# Patient Record
Sex: Female | Born: 1940 | Race: White | Hispanic: No | Marital: Married | Smoking: Never smoker
Health system: Southern US, Community
[De-identification: ages and names within clinical notes are randomized; demographics above are authoritative.]

## PROBLEM LIST (undated history)

## (undated) DIAGNOSIS — C50919 Malignant neoplasm of unspecified site of unspecified female breast: Secondary | ICD-10-CM

## (undated) DIAGNOSIS — D249 Benign neoplasm of unspecified breast: Secondary | ICD-10-CM

## (undated) DIAGNOSIS — N6459 Other signs and symptoms in breast: Secondary | ICD-10-CM

## (undated) DIAGNOSIS — K5790 Diverticulosis of intestine, part unspecified, without perforation or abscess without bleeding: Secondary | ICD-10-CM

## (undated) DIAGNOSIS — R011 Cardiac murmur, unspecified: Secondary | ICD-10-CM

## (undated) DIAGNOSIS — D0501 Lobular carcinoma in situ of right breast: Principal | ICD-10-CM

## (undated) HISTORY — DX: Cardiac murmur, unspecified: R01.1

## (undated) HISTORY — DX: Benign neoplasm of unspecified breast: D24.9

## (undated) HISTORY — DX: Diverticulosis of intestine, part unspecified, without perforation or abscess without bleeding: K57.90

## (undated) HISTORY — DX: Lobular carcinoma in situ of right breast: D05.01

---

## 1989-04-23 HISTORY — PX: BREAST EXCISIONAL BIOPSY: SUR124

## 1995-11-22 HISTORY — PX: BREAST EXCISIONAL BIOPSY: SUR124

## 2000-07-24 DIAGNOSIS — D0501 Lobular carcinoma in situ of right breast: Secondary | ICD-10-CM

## 2000-07-24 DIAGNOSIS — D249 Benign neoplasm of unspecified breast: Secondary | ICD-10-CM

## 2000-07-24 DIAGNOSIS — C50919 Malignant neoplasm of unspecified site of unspecified female breast: Secondary | ICD-10-CM

## 2000-07-24 HISTORY — DX: Lobular carcinoma in situ of right breast: D05.01

## 2000-07-24 HISTORY — PX: BREAST LUMPECTOMY: SHX2

## 2000-07-24 HISTORY — DX: Malignant neoplasm of unspecified site of unspecified female breast: C50.919

## 2000-07-24 HISTORY — DX: Benign neoplasm of unspecified breast: D24.9

## 2000-11-26 ENCOUNTER — Encounter: Payer: Self-pay | Admitting: General Surgery

## 2000-11-26 ENCOUNTER — Encounter: Admission: RE | Admit: 2000-11-26 | Discharge: 2000-11-26 | Payer: Self-pay | Admitting: General Surgery

## 2000-11-28 ENCOUNTER — Encounter: Admission: RE | Admit: 2000-11-28 | Discharge: 2000-11-28 | Payer: Self-pay | Admitting: General Surgery

## 2000-11-28 ENCOUNTER — Encounter (INDEPENDENT_AMBULATORY_CARE_PROVIDER_SITE_OTHER): Payer: Self-pay | Admitting: *Deleted

## 2000-11-28 ENCOUNTER — Ambulatory Visit (HOSPITAL_BASED_OUTPATIENT_CLINIC_OR_DEPARTMENT_OTHER): Admission: RE | Admit: 2000-11-28 | Discharge: 2000-11-28 | Payer: Self-pay | Admitting: General Surgery

## 2000-11-28 ENCOUNTER — Encounter: Payer: Self-pay | Admitting: General Surgery

## 2001-01-17 ENCOUNTER — Ambulatory Visit: Admission: RE | Admit: 2001-01-17 | Discharge: 2001-02-20 | Payer: Self-pay | Admitting: Radiation Oncology

## 2001-02-21 ENCOUNTER — Ambulatory Visit: Admission: RE | Admit: 2001-02-21 | Discharge: 2001-05-22 | Payer: Self-pay | Admitting: Radiation Oncology

## 2001-06-03 ENCOUNTER — Encounter: Payer: Self-pay | Admitting: General Surgery

## 2001-06-03 ENCOUNTER — Encounter: Admission: RE | Admit: 2001-06-03 | Discharge: 2001-06-03 | Payer: Self-pay | Admitting: General Surgery

## 2001-12-03 ENCOUNTER — Encounter: Payer: Self-pay | Admitting: General Surgery

## 2001-12-03 ENCOUNTER — Encounter: Admission: RE | Admit: 2001-12-03 | Discharge: 2001-12-03 | Payer: Self-pay | Admitting: General Surgery

## 2002-03-03 ENCOUNTER — Encounter: Payer: Self-pay | Admitting: Emergency Medicine

## 2002-03-03 ENCOUNTER — Emergency Department (HOSPITAL_COMMUNITY): Admission: EM | Admit: 2002-03-03 | Discharge: 2002-03-03 | Payer: Self-pay | Admitting: Emergency Medicine

## 2002-03-24 DIAGNOSIS — R011 Cardiac murmur, unspecified: Secondary | ICD-10-CM

## 2002-03-24 HISTORY — DX: Cardiac murmur, unspecified: R01.1

## 2002-06-11 ENCOUNTER — Encounter: Payer: Self-pay | Admitting: General Surgery

## 2002-06-11 ENCOUNTER — Encounter: Admission: RE | Admit: 2002-06-11 | Discharge: 2002-06-11 | Payer: Self-pay | Admitting: General Surgery

## 2003-06-17 ENCOUNTER — Encounter: Admission: RE | Admit: 2003-06-17 | Discharge: 2003-06-17 | Payer: Self-pay | Admitting: General Surgery

## 2003-12-23 HISTORY — PX: VAGINAL HYSTERECTOMY: SUR661

## 2004-06-22 ENCOUNTER — Encounter: Admission: RE | Admit: 2004-06-22 | Discharge: 2004-06-22 | Payer: Self-pay | Admitting: General Surgery

## 2004-08-16 ENCOUNTER — Ambulatory Visit: Payer: Self-pay | Admitting: Family Medicine

## 2004-11-16 ENCOUNTER — Ambulatory Visit (HOSPITAL_COMMUNITY): Admission: RE | Admit: 2004-11-16 | Discharge: 2004-11-16 | Payer: Self-pay | Admitting: Internal Medicine

## 2004-11-16 ENCOUNTER — Ambulatory Visit: Payer: Self-pay | Admitting: Internal Medicine

## 2004-11-16 HISTORY — PX: COLONOSCOPY: SHX174

## 2005-01-12 ENCOUNTER — Ambulatory Visit: Payer: Self-pay | Admitting: Family Medicine

## 2005-06-27 ENCOUNTER — Encounter: Admission: RE | Admit: 2005-06-27 | Discharge: 2005-06-27 | Payer: Self-pay | Admitting: Oncology

## 2005-07-03 ENCOUNTER — Ambulatory Visit: Payer: Self-pay | Admitting: Family Medicine

## 2005-07-05 ENCOUNTER — Encounter: Admission: RE | Admit: 2005-07-05 | Discharge: 2005-07-05 | Payer: Self-pay | Admitting: Oncology

## 2005-07-12 ENCOUNTER — Encounter: Admission: RE | Admit: 2005-07-12 | Discharge: 2005-07-12 | Payer: Self-pay | Admitting: Oncology

## 2005-11-16 ENCOUNTER — Ambulatory Visit: Payer: Self-pay | Admitting: Family Medicine

## 2006-01-04 ENCOUNTER — Ambulatory Visit: Payer: Self-pay | Admitting: Family Medicine

## 2006-03-05 ENCOUNTER — Ambulatory Visit: Payer: Self-pay | Admitting: Oncology

## 2006-05-14 ENCOUNTER — Ambulatory Visit: Payer: Self-pay | Admitting: Family Medicine

## 2006-06-29 ENCOUNTER — Encounter: Admission: RE | Admit: 2006-06-29 | Discharge: 2006-06-29 | Payer: Self-pay | Admitting: Oncology

## 2006-07-31 ENCOUNTER — Ambulatory Visit: Payer: Self-pay | Admitting: Family Medicine

## 2006-09-18 ENCOUNTER — Ambulatory Visit: Payer: Self-pay | Admitting: Family Medicine

## 2007-02-13 ENCOUNTER — Ambulatory Visit: Payer: Self-pay | Admitting: Cardiology

## 2007-05-20 ENCOUNTER — Ambulatory Visit: Payer: Self-pay | Admitting: Oncology

## 2007-07-01 ENCOUNTER — Encounter: Admission: RE | Admit: 2007-07-01 | Discharge: 2007-07-01 | Payer: Self-pay | Admitting: Oncology

## 2008-07-01 ENCOUNTER — Encounter: Admission: RE | Admit: 2008-07-01 | Discharge: 2008-07-01 | Payer: Self-pay | Admitting: Oncology

## 2008-07-08 ENCOUNTER — Ambulatory Visit: Payer: Self-pay | Admitting: Oncology

## 2008-08-14 ENCOUNTER — Ambulatory Visit: Payer: Self-pay | Admitting: Oncology

## 2009-07-02 ENCOUNTER — Encounter: Admission: RE | Admit: 2009-07-02 | Discharge: 2009-07-02 | Payer: Self-pay | Admitting: Oncology

## 2009-07-27 ENCOUNTER — Ambulatory Visit: Payer: Self-pay | Admitting: Oncology

## 2009-08-09 LAB — BASIC METABOLIC PANEL
Calcium: 9.6 mg/dL (ref 8.4–10.5)
Chloride: 103 mEq/L (ref 96–112)
Potassium: 3.7 mEq/L (ref 3.5–5.3)

## 2009-08-13 ENCOUNTER — Ambulatory Visit (HOSPITAL_COMMUNITY): Admission: RE | Admit: 2009-08-13 | Discharge: 2009-08-13 | Payer: Self-pay | Admitting: Oncology

## 2010-07-04 ENCOUNTER — Encounter
Admission: RE | Admit: 2010-07-04 | Discharge: 2010-07-04 | Payer: Self-pay | Source: Home / Self Care | Attending: Oncology | Admitting: Oncology

## 2010-07-21 ENCOUNTER — Ambulatory Visit: Payer: Self-pay | Admitting: Oncology

## 2010-08-14 ENCOUNTER — Encounter: Payer: Self-pay | Admitting: Oncology

## 2010-08-29 ENCOUNTER — Other Ambulatory Visit: Payer: Self-pay | Admitting: Oncology

## 2010-08-29 DIAGNOSIS — Z1239 Encounter for other screening for malignant neoplasm of breast: Secondary | ICD-10-CM

## 2010-08-29 DIAGNOSIS — C50919 Malignant neoplasm of unspecified site of unspecified female breast: Secondary | ICD-10-CM

## 2010-12-09 NOTE — Op Note (Signed)
NAME:  Andrea Ramos, Andrea Ramos                 ACCOUNT NO.:  [id number]   MEDICAL RECORD NO.:  [id number]          PATIENT TYPE:  AMB   LOCATION:  DAY                           FACILITY:  [facility]   PHYSICIAN:  R. Roetta Sessions, M.D. DATE OF BIRTH:  [birth year]   DATE OF PROCEDURE:  11/16/2004  DATE OF DISCHARGE:                                 OPERATIVE REPORT   PROCEDURE:  Colonoscopy with snare polypectomy and biopsy.   INDICATIONS FOR PROCEDURE:  The patient is a 70 year old lady sent over at  the courtesy of Dr. Ernestina Penna for colorectal cancer screening. She has  never had a colonoscopy. She is devoid of any lower GI tract symptoms.  Personal history is significant for limited stage breast cancer. Colonoscopy  is now being done as a screening maneuver. This approach has been discussed  with the patient.  Potential risks, benefits, and alternatives have been  reviewed and questions answered. Please see documentation in the medical  record.   PROCEDURE NOTE:  O2 saturation, blood pressure, pulse, and respirations were  monitored throughout the entire procedure. Conscious sedation with Versed 4  mg IV and Demerol 100 mg IV. Ampicillin 2 g IV, gentamicin 120 mg IV for SB  prophylaxis.   INSTRUMENT:  Olympus video chip system.   FINDINGS:  Digital exam revealed no findings.   ENDOSCOPIC FINDINGS:  Prep was adequate.   Rectum:  Examination of the rectal mucosa including retroflexed view of the  anal verge revealed only internal hemorrhoids.   Colon:  Colonic mucosa was surveyed from the rectosigmoid junction through  the left, transverse, and right colon to the area of the appendiceal  orifice, ileocecal valve, and cecum. These structures were well seen and  photographed for the record. Olympus videoscope was slowly withdrawn. All  previously mentioned mucosa surfaces were again seen. The following  abnormalities were noted:  1.  The patient had scattered left sided diverticula.  2.  At 30  cm, there was a 5-mm pedunculated polyp on a stalk in association      with indurated mucosa with an approximately 2 to 3 cm in total dimension      with some central dimpling.   Advancing the scope back and forth over this area, it was notable that the  tip of the scope caught on this area, and it seemed to be somewhat hard and  fibrotic. Subsequently, the polypoid portion of it was removed with snare  and recovered through the scope. Multiple biopsies of the center of this  area of abnormal mucosa was taken for histologic study. The remainder of  colonic mucosa appeared normal. The patient tolerated the procedure well and  was reactive to endoscopy.   IMPRESSION:  1.  Normal rectum aside from internal hemorrhoids.  2.  Scattered left sided diverticula.  3.  Polyp at 30 cm removed with snare. Some dimpling of the mucosa in      association with this polyp of uncertain clinical significance as      described above, biopsied multiple times separately. Remainder of  colonic mucosa appeared normal.   RECOMMENDATIONS:  1.  No aspirin or arthritis medications for the next 10 days.  2.  Followup on pathology.  3.  Further recommendations to follow.      RMR/MEDQ  D:  11/16/2004  T:  11/16/2004  Job:  [id number]   cc:   Delaney Meigs, M.D.  [address]  [place]  [place] [address]  Fax: [phone]   Ernestina Penna  [address]  [place]  [place] [address]  Fax: [phone]

## 2010-12-09 NOTE — Consult Note (Signed)
   NAME:  Andrea Ramos, Andrea Ramos                           ACCOUNT NO.:  [id number]   MEDICAL RECORD NO.:  [id number]                   PATIENT TYPE:  EMS   LOCATION:  ED                                   FACILITY:  [facility]   PHYSICIAN:  J. Darreld Mclean, M.D.              DATE OF BIRTH:  15-Mar-1941   DATE OF CONSULTATION:  03/03/2002  DATE OF DISCHARGE:  03/03/2002                          ORTHOPEDIC SURGERY CONSULTATION   CHIEF COMPLAINT:  I fell and hurt my elbow.   HISTORY OF PRESENT ILLNESS:  The patient fell and hurt her left elbow and  arm when she fell yesterday.  She has significant bruising and tenderness  and she cannot fully extend her elbow.  She has no other injuries.  No loss  of conscious.  Dr. Dewaine Conger saw her today and ask that I see her.  X-rays  taken today in the ER reveal a radial head fracture nondisplaced.  There was  a question of maybe a small particle of bone within the joint.  There is no  fracture of the distal humerus that is apparent.   She had significant ecchymosis and bruising of the left arm and around the  elbow area.  There is a slight effusion to the elbow and she lacks full  supination and pronation.   No other injuries.   IMPRESSION:  Acute fracture of left radial head and questionable intra-  articular loose body.   PLAN:  I have recommended posterior splint and the patient would just rather  have a sling and not have the splint.  She says she will be careful and use  a sling.  I will agree to this on that condition.  Prescription for Vicodin  5/500 for pain.  I will see her in the office this Friday or next Monday.  If any difficulty in the interim, she is to let me know.  Numbers have been  provided.  The patient information booklet has been provided.                                               Teola Bradley, M.D.    JWK/MEDQ  D:  03/03/2002  T:  03/09/2002  Job:  [id number]   cc:   Hilario Quarry, M.D.   [facility]

## 2011-07-06 ENCOUNTER — Ambulatory Visit
Admission: RE | Admit: 2011-07-06 | Discharge: 2011-07-06 | Disposition: A | Discharge status: Home / Self Care | Payer: Medicare Other | Source: Ambulatory Visit | Attending: Oncology | Admitting: Oncology

## 2011-07-06 DIAGNOSIS — Z1239 Encounter for other screening for malignant neoplasm of breast: Secondary | ICD-10-CM

## 2011-07-15 ENCOUNTER — Telehealth: Payer: Self-pay | Admitting: Oncology

## 2011-07-15 NOTE — Telephone Encounter (Signed)
S/w the pt's dtr and she is aware of the r/s appts from jan to march due to the md is due to have surgery

## 2011-08-16 ENCOUNTER — Ambulatory Visit (HOSPITAL_COMMUNITY)
Admission: RE | Admit: 2011-08-16 | Discharge: 2011-08-16 | Disposition: A | Discharge status: Home / Self Care | Payer: Medicare Other | Source: Ambulatory Visit | Attending: Oncology | Admitting: Oncology

## 2011-08-16 ENCOUNTER — Telehealth: Payer: Self-pay

## 2011-08-16 DIAGNOSIS — C50919 Malignant neoplasm of unspecified site of unspecified female breast: Secondary | ICD-10-CM | POA: Insufficient documentation

## 2011-08-16 LAB — CREATININE, SERUM
Creatinine, Ser: 0.95 mg/dL (ref 0.50–1.10)
GFR calc Af Amer: 69 mL/min — ABNORMAL LOW (ref >90)

## 2011-08-16 MED ORDER — GADOBENATE DIMEGLUMINE 529 MG/ML IV SOLN
18.0000 mL | Freq: Once | INTRAVENOUS | Status: AC | PRN
  Administered 2011-08-16: 18 mL via INTRAVENOUS

## 2011-08-16 NOTE — Telephone Encounter (Signed)
Pt notified of MRI results per Dr Cyndie Chime.  Pt verbalizes understanding. dph

## 2011-08-16 NOTE — Telephone Encounter (Signed)
Message copied by Albertha Ghee on Wed Aug 16, 2011  4:34 PM ------      Message from: Levert Feinstein      Created: Wed Aug 16, 2011  1:44 PM       Call patient - MRI breasts normal

## 2011-09-22 ENCOUNTER — Encounter: Payer: Self-pay | Admitting: Oncology

## 2011-09-22 ENCOUNTER — Ambulatory Visit (HOSPITAL_BASED_OUTPATIENT_CLINIC_OR_DEPARTMENT_OTHER): Payer: Medicare Other | Admitting: Oncology

## 2011-09-22 VITALS — BP 130/77 | HR 73 | Temp 98.3°F | Ht 63.0 in | Wt 201.4 lb

## 2011-09-22 DIAGNOSIS — D0590 Unspecified type of carcinoma in situ of unspecified breast: Secondary | ICD-10-CM

## 2011-09-22 DIAGNOSIS — D249 Benign neoplasm of unspecified breast: Secondary | ICD-10-CM

## 2011-09-22 DIAGNOSIS — D0501 Lobular carcinoma in situ of right breast: Secondary | ICD-10-CM

## 2011-09-22 DIAGNOSIS — N6019 Diffuse cystic mastopathy of unspecified breast: Secondary | ICD-10-CM

## 2011-09-22 NOTE — Progress Notes (Signed)
Hematology and Oncology Follow Up Visit  Andrea Ramos [id number] 01/03/1941 71 y.o. 09/22/2011 7:07 PM   Principle Diagnosis: Encounter Diagnoses  Name Primary?  . 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 Cystic disease of breast      Interim History:    Follow-up visit for this 71 year-old woman with a history of fibrocytic disease of the breast, status post multiple bilateral biopsies in the past.  She underwent a lumpectomy of the right breast 11/28/00.  She was found to have a 0.1 cm area of LCIS and a 1 mm tubular carcinoma.  She has had close follow up and has had no signs of any new disease.   Medications: reviewed  Allergies: No Known Allergies  Review of Systems: Constitutional:  No constitutional symptoms  Respiratory: No cough or dyspnea Cardiovascular:  No chest pain or palpitations Gastrointestinal: No change in bowel habit Genito-Urinary: No vaginal bleeding Musculoskeletal: No bone pain Neurologic: No headache or change in vision Skin: No rash Remaining ROS negative.  Physical Exam: Blood pressure 130/77, pulse 73, temperature 98.3 F (36.8 C), temperature source Oral, height 5\' 3"  (1.6 m), weight 201 lb 6.4 oz (91.354 kg). Wt Readings from Last 3 Encounters:  09/22/11 201 lb 6.4 oz (91.354 kg)     General appearance: Well-nourished Caucasian woman [name]: Pharynx no erythema or exudate Lymph nodes: No cervical supraclavicular or axillary adenopathy Breasts: No dominant breast mass Lungs: Clear to auscultation resonant to percussion Heart: Regular cardiac rhythm no murmur Abdomen: Soft nontender no mass no organomegaly Extremities: No edema no calf tenderness Vascular: No cyanosis Neurologic: Motor strength 5 over 5 reflexes 1+ symmetric Skin: No rash or ecchymosis GU: Pap smear by her gynecologist in November 2012 reported to her as negative  Lab Results: No results found for this basename: WBC, HGB, HCT, MCV, PLT     Chemistry        Component Value Date/Time   NA 138 08/09/2009 1353   K 3.7 08/09/2009 1353   CL 103 08/09/2009 1353   CO2 26 08/09/2009 1353   BUN 32* 08/09/2009 1353   CREATININE 0.95 08/16/2011 0838      Component Value Date/Time   CALCIUM 9.6 08/09/2009 1353       Radiological Studies: Bilateral mammograms done 07/06/2011 with scattered fibroglandular densities but no dominant mass no suspicion for malignancy Bilateral MRIs of the breast done 08/16/2011 with no pathology   Impression and Plan: #1. Fibrocystic disease of the breast. 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 I would be happy to see her again in the future if the need arises. I did go ahead and schedule her next set of mammograms for December of 2013.   CC:. Dr. Joette Catching; Dr. Ernestina Penna   Levert Feinstein, MD 3/1/20137:07 PM

## 2011-09-23 ENCOUNTER — Other Ambulatory Visit: Payer: Self-pay | Admitting: Oncology

## 2011-09-23 DIAGNOSIS — D0501 Lobular carcinoma in situ of right breast: Secondary | ICD-10-CM

## 2011-09-23 DIAGNOSIS — D249 Benign neoplasm of unspecified breast: Secondary | ICD-10-CM

## 2011-09-27 ENCOUNTER — Telehealth: Payer: Self-pay | Admitting: Oncology

## 2011-09-27 NOTE — Telephone Encounter (Signed)
Talked to pt, gave her appt for mammogram in December 2013

## 2011-12-13 ENCOUNTER — Encounter: Payer: Self-pay | Admitting: Internal Medicine

## 2011-12-14 ENCOUNTER — Ambulatory Visit: Payer: Medicare Other | Admitting: Urgent Care

## 2011-12-19 ENCOUNTER — Encounter: Payer: Self-pay | Admitting: Urgent Care

## 2011-12-19 ENCOUNTER — Ambulatory Visit (INDEPENDENT_AMBULATORY_CARE_PROVIDER_SITE_OTHER): Payer: Medicare Other | Admitting: Urgent Care

## 2011-12-19 VITALS — BP 148/81 | HR 82 | Temp 97.8°F | Ht 64.0 in | Wt 206.8 lb

## 2011-12-19 DIAGNOSIS — K921 Melena: Secondary | ICD-10-CM | POA: Insufficient documentation

## 2011-12-19 NOTE — Patient Instructions (Addendum)
You need a colonoscopy w/ Dr Jena Gauss If your bleeding returns prior to colonoscopy or his severe, please call us or go to the emergency department Bloody Stools Bloody stools often mean that there is a problem in the digestive tract. Your caregiver may use the term "melena" to describe black, tarry, and bad smelling stools or "hematochezia" to describe red or maroon-colored stools. Blood seen in the stool can be caused by bleeding anywhere along the intestinal tract.  A black stool usually means that blood is coming from the upper part of the gastrointestinal tract (esophagus, stomach, or small bowel). Passing maroon-colored stools or bright red blood usually means that blood is coming from lower down in the large bowel or the rectum. However, sometimes massive bleeding in the stomach or small intestine can cause bright red bloody stools.  Consuming black licorice, lead, iron pills, medicines containing bismuth subsalicylate, or blueberries can also cause black stools. Your caregiver can test black stools to see if blood is present. It is important that the cause of the bleeding be found. Treatment can then be started, and the problem can be corrected. Rectal bleeding may not be serious, but you should not assume everything is okay until you know the cause.It is very important to follow up with your caregiver or a specialist in gastrointestinal problems. CAUSES  Blood in the stools can come from various underlying causes.Often, the cause is not found during your first visit. Testing is often needed to discover the cause of bleeding in the gastrointestinal tract. Causes range from simple to serious or even life-threatening.Possible causes include:  Hemorrhoids.These are veins that are full of blood (engorged) in the rectum. They cause pain, inflammation, and may bleed.   Anal fissures.These are areas of painful tearing which may bleed. They are often caused by passing hard stool.    Diverticulosis.These are pouches that form on the colon over time, with age, and may bleed significantly.   Diverticulitis.This is inflammation in areas with diverticulosis. It can cause pain, fever, and bloody stools, although bleeding is rare.   Proctitis and colitis. These are inflamed areas of the rectum or colon. They may cause pain, fever, and bloody stools.   Polyps and cancer. Colon cancer is a leading cause of preventable cancer death.It often starts out as precancerous polyps that can be removed during a colonoscopy, preventing progression into cancer. Sometimes, polyps and cancer may cause rectal bleeding.   Gastritis and ulcers.Bleeding from the upper gastrointestinal tract (near the stomach) may travel through the intestines and produce black, sometimes tarry, often bad smelling stools. In certain cases, if the bleeding is fast enough, the stools may not be black, but red and the condition may be life-threatening.  SYMPTOMS  You may have stools that are bright red and bloody, that are normal color with blood on them, or that are dark black and tarry. In some cases, you may only have blood in the toilet bowl. Any of these cases need medical care. You may also have:  Pain at the anus or anywhere in the rectum.   Lightheadedness or feeling faint.   Extreme weakness.   Nausea or vomiting.   Fever.  DIAGNOSIS Your caregiver may use the following methods to find the cause of your bleeding:  Taking a medical history. Age is important. Older people tend to develop polyps and cancer more often. If there is anal pain and a hard, large stool associated with bleeding, a tear of the anus may be the cause.  If blood drips into the toilet after a bowel movement, bleeding hemorrhoids may be the problem. The color and frequency of the bleeding are additional considerations. In most cases, the medical history provides clues, but seldom the final answer.   A visual and finger (digital)  exam. Your caregiver will inspect the anal area, looking for tears and hemorrhoids. A finger exam can provide information when there is tenderness or a growth inside. In men, the prostate is also examined.   Endoscopy. Several types of small, long scopes (endoscopes) are used to view the colon.   In the office, your caregiver may use a rigid, or more commonly, a flexible viewing sigmoidoscope. This exam is called flexible sigmoidoscopy. It is performed in 5 to 10 minutes.   A more thorough exam is accomplished with a colonoscope. It allows your caregiver to view the entire 5 to 6 foot long colon. Medicine to help you relax (sedative) is usually given for this exam. Frequently, a bleeding lesion may be present beyond the reach of the sigmoidoscope. So, a colonoscopy may be the best exam to start with. Both exams are usually done on an outpatient basis. This means the patient does not stay overnight in the hospital or surgery center.   An upper endoscopy may be needed to examine your stomach. Sedation is used and a flexible endoscope is put in your mouth, down to your stomach.   A barium enema X-ray. This is an X-ray exam. It uses liquid barium inserted by enema into the rectum. This test alone may not identify an actual bleeding point. X-rays highlight abnormal shadows, such as those made by lumps (tumors), diverticuli, or colitis.  TREATMENT  Treatment depends on the cause of your bleeding.   For bleeding from the stomach or colon, the caregiver doing your endoscopy or colonoscopy may be able to stop the bleeding as part of the procedure.   Inflammation or infection of the colon can be treated with medicines.   Many rectal problems can be treated with creams, suppositories, or warm baths.   Surgery is sometimes needed.   Blood transfusions are sometimes needed if you have lost a lot of blood.   For any bleeding problem, let your caregiver know if you take aspirin or other blood thinners  regularly.  HOME CARE INSTRUCTIONS   Take any medicines exactly as prescribed.   Keep your stools soft by eating a diet high in fiber. Prunes (1 to 3 a day) work well for many people.   Drink enough water and fluids to keep your urine clear or pale yellow.   Take sitz baths if advised. A sitz bath is when you sit in a bathtub with warm water for 10 to 15 minutes to soak, soothe, and cleanse the rectal area.   If enemas or suppositories are advised, be sure you know how to use them. Tell your caregiver if you have problems with this.   Monitor your bowel movements to look for signs of improvement or worsening.  SEEK MEDICAL CARE IF:   You do not improve in the time expected.   Your condition worsens after initial improvement.   You develop any new symptoms.  SEEK IMMEDIATE MEDICAL CARE IF:   You develop severe or prolonged rectal bleeding.   You vomit blood.   You feel weak or faint.   You have a fever.  MAKE SURE YOU:  Understand these instructions.   Will watch your condition.   Will get help right away  if you are not doing well or get worse.  Document Released: 06/30/2002 Document Revised: 06/29/2011 Document Reviewed: 11/25/2010 [facility] Patient Information 2012 [place], [place].

## 2011-12-19 NOTE — Progress Notes (Signed)
Primary Care Physician:  NYLAND,LEONARD ROBERT, MD, MD Primary Gastroenterologist:  Dr. Rourk  Chief Complaint  Patient presents with  . Rectal Bleeding    not since the 5/22    HPI:  Andrea Ramos is a 71 y.o. female here for history of rectal bleeding.  She has not been seen by Dr. Rourk some colonoscopy in 2006. She had been doing very well in her usual state of health until May 21 when she went to the bathroom. She saw bright red bleeding w/ a bowel movement.  The blood was in the toilet water & with wiping on the tissue.  This subsided, however the next morning she had taken a small laxative, and noticed a small amount of blood again. She generally has a bowel movement every day, but at times feels as though they are not complete. She has been taking [employer] laxative OTC when necessary.  She also takes stool softeners as needed.  Wt stable.  Denies any upper GI symptoms including heartburn, indigestion, nausea, vomiting, dysphagia, odynophagia or anorexia.  She takes an occasional Aleve prn for aches & pain, but denies any recent use.  Certain foods cause diarrhea once per mo, but no significant problems.   Past Medical History  Diagnosis Date  . 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 Heart murmur 03/2002  . Diverticulosis     Past Surgical History  Procedure Date  . Colonoscopy 11/16/2004     Rourk-Normal rectum aside from internal hemorrhoids/ Scattered left sided diverticula/ Polyp at 30 cm removed, path unavailable at this time, diverticulosis  . Vaginal hysterectomy 12/2003    partial, both ovaries remain    Current Outpatient Prescriptions  Medication Sig Dispense Refill  . Omega-3 Fatty Acids (FISH OIL) 1000 MG CAPS Take 2 capsules by mouth 2 (two) times daily.      . valsartan-hydrochlorothiazide (DIOVAN-HCT) 160-12.5 MG per tablet Take 1 tablet by mouth daily.      . Vitamin D, Ergocalciferol, (DRISDOL) 50000  UNITS CAPS Take 50,000 Units by mouth every 7 (seven) days.        Allergies as of 12/19/2011  . (No Known Allergies)    Family History  Problem Relation Age of Onset  . Colon cancer Sister 67  . Lung cancer Sister 67  . Diabetes Brother   . Multiple myeloma Sister 54    History   Social History  . Marital Status: Married    Spouse Name: N/A    Number of Children: 1  . Years of Education: N/A   Occupational History  . retired; Data Entry Operator    Social History Main Topics  . Smoking status: Never Smoker   . Smokeless tobacco: Not on file  . Alcohol Use: No  . Drug Use: No  . Sexually Active: Not on file   Other Topics Concern  . Not on file   Social History Narrative   Lives w/ husband & 28 yr old granddaughter  Review of Systems: Gen: Denies any fever, chills, sweats, anorexia, fatigue, weakness, malaise, weight loss, and sleep disorder CV: Denies chest pain, angina, palpitations, syncope, orthopnea, PND, peripheral edema, and claudication. Resp: Denies dyspnea at rest, dyspnea with exercise, cough, sputum, wheezing, coughing up blood, and pleurisy. GI: Denies vomiting blood, jaundice, and fecal incontinence.   GU : Denies urinary burning, blood in urine, urinary frequency, urinary hesitancy, nocturnal urination, and urinary incontinence. MS: Denies joint pain,   limitation of movement, and swelling, stiffness, low back pain, extremity pain. Denies muscle weakness, cramps, atrophy.  Derm: Denies rash, itching, dry skin, hives, moles, warts, or unhealing ulcers.  Psych: Denies depression, anxiety, memory loss, suicidal ideation, hallucinations, paranoia, and confusion. Heme: Denies bruising or enlarged lymph nodes. Neuro:  Denies any headaches, dizziness, paresthesias. Endo:  Denies any problems with DM, thyroid, adrenal function.  Physical Exam: BP 148/81  Pulse 82  Temp(Src) 97.8 F (36.6 C) (Temporal)  Ht 5' 4" (1.626 m)  Wt 206 lb 12.8 oz (93.804 kg)   BMI 35.50 kg/m2 General:   Alert,  Well-developed, well-nourished, pleasant and cooperative in NAD. Head:  Normocephalic and atraumatic. Eyes:  Sclera clear, no icterus.   Conjunctiva pink. Ears:  Normal auditory acuity. Nose:  No deformity, discharge, or lesions. Mouth:  No deformity or lesions,oropharynx pink & moist. Neck:  Supple; no masses or thyromegaly. Lungs:  Clear throughout to auscultation.   No wheezes, crackles, or rhonchi. No acute distress. Heart:  Regular rate and rhythm; no murmurs, clicks, rubs,  or gallops. Abdomen:  Normal bowel sounds.  No bruits.  Soft, non-tender and non-distended without masses, hepatosplenomegaly or hernias noted.  No guarding or rebound tenderness.   Rectal:  Deferred until time of colonoscopy Msk:  Symmetrical without gross deformities. Normal posture. Pulses:  Normal pulses noted. Extremities:  No clubbing or edema. Neurologic:  Alert and  oriented x4;  grossly normal neurologically. Skin:  Intact without significant lesions or rashes. Lymph Nodes:  No significant cervical adenopathy. Psych:  Alert and cooperative. Normal mood and affect.   

## 2011-12-19 NOTE — Assessment & Plan Note (Signed)
Andrea Ramos is a pleasant 71 y.o. female with small volume hematochezia. She has history of internal hemorrhoids and colon polyp (path unavailable) back in 2006. She is a family history of colon cancer. She is going to need colonoscopy with Dr. Jena Gauss for further determination of the source of her rectal bleeding which could include benign anorectal source such as hemorrhoids, colon polyp or cancer, diverticular bleeding, or less likely inflammatory bowel disease.  I have discussed risks & benefits which include, but are not limited to, bleeding, infection, perforation & drug reaction.  The patient agrees with this plan & written consent will be obtained.

## 2011-12-20 ENCOUNTER — Other Ambulatory Visit: Payer: Self-pay | Admitting: Gastroenterology

## 2011-12-20 DIAGNOSIS — K921 Melena: Secondary | ICD-10-CM

## 2011-12-20 MED ORDER — PEG-KCL-NACL-NASULF-NA ASC-C 100 G PO SOLR: 1.0000 | Freq: Once | ORAL | Status: DC

## 2011-12-20 NOTE — Progress Notes (Signed)
Faxed to PCP

## 2011-12-27 ENCOUNTER — Ambulatory Visit: Payer: Medicare Other | Admitting: Gastroenterology

## 2012-01-08 ENCOUNTER — Encounter (HOSPITAL_COMMUNITY): Payer: Self-pay | Admitting: Pharmacy Technician

## 2012-01-12 MED ORDER — SODIUM CHLORIDE 0.45 % IV SOLN
Freq: Once | INTRAVENOUS | Status: AC
  Administered 2012-01-15: 09:00:00 via INTRAVENOUS

## 2012-01-15 ENCOUNTER — Ambulatory Visit (HOSPITAL_COMMUNITY)
Admission: RE | Admit: 2012-01-15 | Discharge: 2012-01-15 | Disposition: A | Discharge status: Home / Self Care | Payer: Medicare Other | Source: Ambulatory Visit | Attending: Internal Medicine | Admitting: Internal Medicine

## 2012-01-15 ENCOUNTER — Encounter (HOSPITAL_COMMUNITY)
Admission: RE | Disposition: A | Discharge status: Home / Self Care | Payer: Self-pay | Source: Ambulatory Visit | Attending: Internal Medicine

## 2012-01-15 ENCOUNTER — Encounter (HOSPITAL_COMMUNITY): Payer: Self-pay | Admitting: *Deleted

## 2012-01-15 DIAGNOSIS — K5730 Diverticulosis of large intestine without perforation or abscess without bleeding: Secondary | ICD-10-CM

## 2012-01-15 DIAGNOSIS — D126 Benign neoplasm of colon, unspecified: Secondary | ICD-10-CM | POA: Insufficient documentation

## 2012-01-15 DIAGNOSIS — K921 Melena: Secondary | ICD-10-CM | POA: Insufficient documentation

## 2012-01-15 DIAGNOSIS — K648 Other hemorrhoids: Secondary | ICD-10-CM | POA: Insufficient documentation

## 2012-01-15 DIAGNOSIS — Z853 Personal history of malignant neoplasm of breast: Secondary | ICD-10-CM | POA: Insufficient documentation

## 2012-01-15 HISTORY — PX: COLONOSCOPY: SHX5424

## 2012-01-15 SURGERY — COLONOSCOPY
Anesthesia: Moderate Sedation

## 2012-01-15 MED ORDER — MEPERIDINE HCL 100 MG/ML IJ SOLN
INTRAMUSCULAR | Status: AC
  Filled 2012-01-15: qty 2

## 2012-01-15 MED ORDER — MIDAZOLAM HCL 5 MG/5ML IJ SOLN
INTRAMUSCULAR | Status: DC | PRN
  Administered 2012-01-15 (×2): 2 mg via INTRAVENOUS
  Administered 2012-01-15: 1 mg via INTRAVENOUS

## 2012-01-15 MED ORDER — STERILE WATER FOR IRRIGATION IR SOLN
Status: DC | PRN
  Administered 2012-01-15: 12:00:00

## 2012-01-15 MED ORDER — MEPERIDINE HCL 100 MG/ML IJ SOLN
INTRAMUSCULAR | Status: DC | PRN
  Administered 2012-01-15: 50 mg via INTRAVENOUS
  Administered 2012-01-15 (×2): 25 mg via INTRAVENOUS

## 2012-01-15 MED ORDER — MIDAZOLAM HCL 5 MG/5ML IJ SOLN
INTRAMUSCULAR | Status: AC
  Filled 2012-01-15: qty 10

## 2012-01-15 NOTE — Discharge Instructions (Signed)
Colonoscopy Discharge Instructions  Read the instructions outlined below and refer to this sheet in the next few weeks. These discharge instructions provide you with general information on caring for yourself after you leave the hospital. Your doctor may also give you specific instructions. While your treatment has been planned according to the most current medical practices available, unavoidable complications occasionally occur. If you have any problems or questions after discharge, call Dr. Jena Gauss at [phone]. ACTIVITY  You may resume your regular activity, but move at a slower pace for the next 24 hours.   Take frequent rest periods for the next 24 hours.   Walking will help get rid of the air and reduce the bloated feeling in your belly (abdomen).   No driving for 24 hours (because of the medicine (anesthesia) used during the test).    Do not sign any important legal documents or operate any machinery for 24 hours (because of the anesthesia used during the test).  NUTRITION  Drink plenty of fluids.   You may resume your normal diet as instructed by your doctor.   Begin with a light meal and progress to your normal diet. Heavy or fried foods are harder to digest and may make you feel sick to your stomach (nauseated).   Avoid alcoholic beverages for 24 hours or as instructed.  MEDICATIONS  You may resume your normal medications unless your doctor tells you otherwise.  WHAT YOU CAN EXPECT TODAY  Some feelings of bloating in the abdomen.   Passage of more gas than usual.   Spotting of blood in your stool or on the toilet paper.  IF YOU HAD POLYPS REMOVED DURING THE COLONOSCOPY:  No aspirin products for 7 days or as instructed.   No alcohol for 7 days or as instructed.   Eat a soft diet for the next 24 hours.  FINDING OUT THE RESULTS OF YOUR TEST Not all test results are available during your visit. If your test results are not back during the visit, make an appointment  with your caregiver to find out the results. Do not assume everything is normal if you have not heard from your caregiver or the medical facility. It is important for you to follow up on all of your test results.  SEEK IMMEDIATE MEDICAL ATTENTION IF:  You have more than a spotting of blood in your stool.   Your belly is swollen (abdominal distention).   You are nauseated or vomiting.   You have a temperature over 101.   You have abdominal pain or discomfort that is severe or gets worse throughout the day.    Polyp and diverticulosis information provided.  Recommend daily fiber supplement.  Further recommendations to follow pending review of pathology reportDiverticulosis Diverticulosis is a common condition that develops when small pouches (diverticula) form in the wall of the colon. The risk of diverticulosis increases with age. It happens more often in people who eat a low-fiber diet. Most individuals with diverticulosis have no symptoms. Those individuals with symptoms usually experience abdominal pain, constipation, or loose stools (diarrhea). HOME CARE INSTRUCTIONS   Increase the amount of fiber in your diet as directed by your caregiver or dietician. This may reduce symptoms of diverticulosis.   Your caregiver may recommend taking a dietary fiber supplement.   Drink at least 6 to 8 glasses of water each day to prevent constipation.   Try not to strain when you have a bowel movement.   Your caregiver may recommend avoiding nuts and  seeds to prevent complications, although this is still an uncertain benefit.   Only take over-the-counter or prescription medicines for pain, discomfort, or fever as directed by your caregiver.  FOODS WITH HIGH FIBER CONTENT INCLUDE:  Fruits. Apple, peach, pear, tangerine, raisins, prunes.   Vegetables. Brussels sprouts, asparagus, broccoli, cabbage, carrot, cauliflower, romaine lettuce, spinach, summer squash, tomato, winter squash, zucchini.    Starchy Vegetables. Baked beans, kidney beans, lima beans, split peas, lentils, potatoes (with skin).   Grains. Whole wheat bread, brown rice, bran flake cereal, plain oatmeal, white rice, shredded wheat, bran muffins.  SEEK IMMEDIATE MEDICAL CARE IF:   You develop increasing pain or severe bloating.   You have an oral temperature above 102 F (38.9 C), not controlled by medicine.   You develop vomiting or bowel movements that are bloody or black.  Document Released: 04/06/2004 Document Revised: 06/29/2011 Document Reviewed: 12/08/2009 [facility] Patient Information 2012 [place], [place].Colon Polyps A polyp is extra tissue that grows inside your body. Colon polyps grow in the large intestine. The large intestine, also called the colon, is part of your digestive system. It is a long, hollow tube at the end of your digestive tract where your body makes and stores stool. Most polyps are not dangerous. They are benign. This means they are not cancerous. But over time, some types of polyps can turn into cancer. Polyps that are smaller than a pea are usually not harmful. But larger polyps could someday become or may already be cancerous. To be safe, doctors remove all polyps and test them.  WHO GETS POLYPS? Anyone can get polyps, but certain people are more likely than others. You may have a greater chance of getting polyps if:  You are over 50.   You have had polyps before.   Someone in your family has had polyps.   Someone in your family has had cancer of the large intestine.   Find out if someone in your family has had polyps. You may also be more likely to get polyps if you:   Eat a lot of fatty foods.   Smoke.   Drink alcohol.   Do not exercise.   Eat too much.  SYMPTOMS  Most small polyps do not cause symptoms. People often do not know they have one until their caregiver finds it during a regular checkup or while testing them for something else. Some people do have symptoms  like these:  Bleeding from the anus. You might notice blood on your underwear or on toilet paper after you have had a bowel movement.   Constipation or diarrhea that lasts more than a week.   Blood in the stool. Blood can make stool look black or it can show up as red streaks in the stool.  If you have any of these symptoms, see your caregiver. HOW DOES THE DOCTOR TEST FOR POLYPS? The doctor can use four tests to check for polyps:  Digital rectal exam. The caregiver wears gloves and checks your rectum (the last part of the large intestine) to see if it feels normal. This test would find polyps only in the rectum. Your caregiver may need to do one of the other tests listed below to find polyps higher up in the intestine.   Barium enema. The caregiver puts a liquid called barium into your rectum before taking x-rays of your large intestine. Barium makes your intestine look white in the pictures. Polyps are dark, so they are easy to see.   Sigmoidoscopy. With  this test, the caregiver can see inside your large intestine. A thin flexible tube is placed into your rectum. The device is called a sigmoidoscope, which has a light and a tiny video camera in it. The caregiver uses the sigmoidoscope to look at the last third of your large intestine.   Colonoscopy. This test is like sigmoidoscopy, but the caregiver looks at all of the large intestine. It usually requires sedation. This is the most common method for finding and removing polyps.  TREATMENT   The caregiver will remove the polyp during sigmoidoscopy or colonoscopy. The polyp is then tested for cancer.   If you have had polyps, your caregiver may want you to get tested regularly in the future.  PREVENTION  There is not one sure way to prevent polyps. You might be able to lower your risk of getting them if you:  Eat more fruits and vegetables and less fatty food.   Do not smoke.   Avoid alcohol.   Exercise every day.   Lose weight if  you are overweight.   Eating more calcium and folate can also lower your risk of getting polyps. Some foods that are rich in calcium are milk, cheese, and broccoli. Some foods that are rich in folate are chickpeas, kidney beans, and spinach.   Aspirin might help prevent polyps. Studies are under way.  Document Released: 04/05/2004 Document Revised: 06/29/2011 Document Reviewed: 09/11/2007 [facility] Patient Information 2012 [place], [place].High Fiber Diet A high fiber diet changes your normal diet to include more whole grains, legumes, fruits, and vegetables. Changes in the diet involve replacing refined carbohydrates with unrefined foods. The calorie level of the diet is essentially unchanged. The Dietary Reference Intake (recommended amount) for adult males is 38 g per day. For adult females, it is 25 g per day. Pregnant and lactating women should consume 28 g of fiber per day. Fiber is the intact part of a plant that is not broken down during digestion. Functional fiber is fiber that has been isolated from the plant to provide a beneficial effect in the body. PURPOSE  Increase stool bulk.   Ease and regulate bowel movements.   Lower cholesterol.  INDICATIONS THAT YOU NEED MORE FIBER  Constipation and hemorrhoids.   Uncomplicated diverticulosis (intestine condition) and irritable bowel syndrome.   Weight management.   As a protective measure against hardening of the arteries (atherosclerosis), diabetes, and cancer.  NOTE OF CAUTION If you have a digestive or bowel problem, ask your caregiver for advice before adding high fiber foods to your diet. Some of the following medical problems are such that a high fiber diet should not be used without consulting your caregiver:  Acute diverticulitis (intestine infection).   Partial small bowel obstructions.   Complicated diverticular disease involving bleeding, rupture (perforation), or abscess (boil, furuncle).   Presence of autonomic  neuropathy (nerve damage) or gastric paresis (stomach cannot empty itself).  GUIDELINES FOR INCREASING FIBER  Start adding fiber to the diet slowly. A gradual increase of about 5 more grams (2 slices of whole-wheat bread, 2 servings of most fruits or vegetables, or 1 bowl of high fiber cereal) per day is best. Too rapid an increase in fiber may result in constipation, flatulence, and bloating.   Drink enough water and fluids to keep your urine clear or pale yellow. Water, juice, or caffeine-free drinks are recommended. Not drinking enough fluid may cause constipation.   Eat a variety of high fiber foods rather than one type of fiber.  Try to increase your intake of fiber through using high fiber foods rather than fiber pills or supplements that contain small amounts of fiber.   The goal is to change the types of food eaten. Do not supplement your present diet with high fiber foods, but replace foods in your present diet.  INCLUDE A VARIETY OF FIBER SOURCES  Replace refined and processed grains with whole grains, canned fruits with fresh fruits, and incorporate other fiber sources. White rice, white breads, and most bakery goods contain little or no fiber.   Brown whole-grain rice, buckwheat oats, and many fruits and vegetables are all good sources of fiber. These include: broccoli, Brussels sprouts, cabbage, cauliflower, beets, sweet potatoes, white potatoes (skin on), carrots, tomatoes, eggplant, squash, berries, fresh fruits, and dried fruits.   Cereals appear to be the richest source of fiber. Cereal fiber is found in whole grains and bran. Bran is the fiber-rich outer coat of cereal grain, which is largely removed in refining. In whole-grain cereals, the bran remains. In breakfast cereals, the largest amount of fiber is found in those with "bran" in their names. The fiber content is sometimes indicated on the label.   You may need to include additional fruits and vegetables each day.   In  baking, for 1 cup white flour, you may use the following substitutions:   1 cup whole-wheat flour minus 2 tbs.    cup white flour plus  cup whole-wheat flour.  Document Released: 07/10/2005 Document Revised: 06/29/2011 Document Reviewed: 05/18/2009 [facility] Patient Information 2012 [place], [place].

## 2012-01-15 NOTE — Op Note (Signed)
[facility] [address] [place], [place]  [address]  COLONOSCOPY PROCEDURE REPORT  PATIENT:  Andrea Ramos, Andrea Ramos  MR#:  [id number] BIRTHDATE:  1941/05/05, 71 yrs. old  GENDER:  female ENDOSCOPIST:  R. Roetta Sessions, MD FACP [facility] REF. BY:  Joette Catching, M.D. PROCEDURE DATE:  01/15/2012 PROCEDURE:  Colonoscopy with biopsy  INDICATIONS:  Couple episodes of self-limiting hematochezia  INFORMED CONSENT:  The risks, benefits, alternatives and imponderables including but not limited to bleeding, perforation as well as the possibility of a missed lesion have been reviewed. The potential for biopsy, lesion removal, etc. have also been discussed.  Questions have been answered.  All parties agreeable. Please see the history and physical in the medical record for more information.  MEDICATIONS:  Versed 5 mg IV and Demerol 100 mg IV in divided doses.  DESCRIPTION OF PROCEDURE:  After a digital rectal exam was performed, the EC-[id number] ([id number]) colonoscope was advanced from the anus through the rectum and colon to the area of the cecum, ileocecal valve and appendiceal orifice.  The cecum was deeply intubated.  These structures were well-seen and photographed for the record.  From the level of the cecum and ileocecal valve, the scope was slowly and cautiously withdrawn.  The mucosal surfaces were carefully surveyed utilizing scope tip deflection to facilitate fold flattening as needed.  The scope was pulled down into the rectum where a thorough examination including retroflexion was performed. <<PROCEDUREIMAGES>>  FINDINGS: Adequate preparation. Minimal internal hemorrhoids; otherwise normal rectum. Scattered left-sided diverticula; single diminutive polyp        in the descending segment; otherwise, the remainder of the colonic mucosa appeared normal.  THERAPEUTIC / DIAGNOSTIC MANEUVERS PERFORMED:  The descending colon polyp was removed via cold biopsy technique  COMPLICATIONS:   None  CECAL WITHDRAWAL TIME: 11 minutes  IMPRESSION:  Minimal internal hemorrhoids. Colonic diverticulosis. Single diminutive polyp-status post cold biopsy removal. I suspect relatively trivial GI bleed recently.  RECOMMENDATIONS:   Daily fiber supplementation. Follow up on pathology.  ______________________________ R. Roetta Sessions, MD Caleen Essex  CC:  Joette Catching, M.D.  n. eSIGNED:   R. Casimiro Needle Sheva Mcdougle at 01/15/2012 12:03 PM  Loren Racer, [phone]

## 2012-01-15 NOTE — Interval H&P Note (Signed)
History and Physical Interval Note:  01/15/2012 11:27 AM  Andrea Ramos  has presented today for surgery, with the diagnosis of hematochezia  The various methods of treatment have been discussed with the patient and family. After consideration of risks, benefits and other options for treatment, the patient has consented to  Procedure(s) (LRB): COLONOSCOPY (N/A) as a surgical intervention .  The patient's history has been reviewed, patient examined, no change in status, stable for surgery.  I have reviewed the patients' chart and labs.  Questions were answered to the patient's satisfaction.     [name]

## 2012-01-15 NOTE — H&P (View-Only) (Signed)
Primary Care Physician:  Josue Hector, MD, MD Primary Gastroenterologist:  Dr. Jena Gauss  Chief Complaint  Patient presents with  . Rectal Bleeding    not since the 5/22    HPI:  Andrea Ramos is a 71 y.o. female here for history of rectal bleeding.  She has not been seen by Dr. Jena Gauss some colonoscopy in 2006. She had been doing very well in her usual state of health until May 21 when she went to the bathroom. She saw bright red bleeding w/ a bowel movement.  The blood was in the toilet water & with wiping on the tissue.  This subsided, however the next morning she had taken a small laxative, and noticed a small amount of blood again. She generally has a bowel movement every day, but at times feels as though they are not complete. She has been taking [employer] laxative OTC when necessary.  She also takes stool softeners as needed.  Wt stable.  Denies any upper GI symptoms including heartburn, indigestion, nausea, vomiting, dysphagia, odynophagia or anorexia.  She takes an occasional Aleve prn for aches & pain, but denies any recent use.  Certain foods cause diarrhea once per mo, but no significant problems.   Past Medical History  Diagnosis Date  . 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 Heart murmur 03/2002  . Diverticulosis     Past Surgical History  Procedure Date  . Colonoscopy 11/16/2004     [facility]k-Normal rectum aside from internal hemorrhoids/ Scattered left sided diverticula/ Polyp at 30 cm removed, path unavailable at this time, diverticulosis  . Vaginal hysterectomy 12/2003    partial, both ovaries remain    Current Outpatient Prescriptions  Medication Sig Dispense Refill  . Omega-3 Fatty Acids (FISH OIL) 1000 MG CAPS Take 2 capsules by mouth 2 (two) times daily.      . valsartan-hydrochlorothiazide (DIOVAN-HCT) 160-12.5 MG per tablet Take 1 tablet by mouth daily.      . Vitamin D, Ergocalciferol, (DRISDOL) 50000  UNITS CAPS Take 50,000 Units by mouth every 7 (seven) days.        Allergies as of 12/19/2011  . (No Known Allergies)    Family History  Problem Relation Age of Onset  . Colon cancer Sister 42  . Lung cancer Sister 47  . Diabetes Brother   . Multiple myeloma Sister 44    History   Social History  . Marital Status: Married    Spouse Name: N/A    Number of Children: 1  . Years of Education: N/A   Occupational History  . retired; Barrister's clerk    Social History Main Topics  . Smoking status: Never Smoker   . Smokeless tobacco: Not on file  . Alcohol Use: No  . Drug Use: No  . Sexually Active: Not on file   Other Topics Concern  . Not on file   Social History Narrative   Lives w/ husband & 28 yr old granddaughter  Review of Systems: Gen: Denies any fever, chills, sweats, anorexia, fatigue, weakness, malaise, weight loss, and sleep disorder CV: Denies chest pain, angina, palpitations, syncope, orthopnea, PND, peripheral edema, and claudication. Resp: Denies dyspnea at rest, dyspnea with exercise, cough, sputum, wheezing, coughing up blood, and pleurisy. GI: Denies vomiting blood, jaundice, and fecal incontinence.   GU : Denies urinary burning, blood in urine, urinary frequency, urinary hesitancy, nocturnal urination, and urinary incontinence. MS: Denies joint pain,  limitation of movement, and swelling, stiffness, low back pain, extremity pain. Denies muscle weakness, cramps, atrophy.  Derm: Denies rash, itching, dry skin, hives, moles, warts, or unhealing ulcers.  Psych: Denies depression, anxiety, memory loss, suicidal ideation, hallucinations, paranoia, and confusion. Heme: Denies bruising or enlarged lymph nodes. Neuro:  Denies any headaches, dizziness, paresthesias. Endo:  Denies any problems with DM, thyroid, adrenal function.  Physical Exam: BP 148/81  Pulse 82  Temp(Src) 97.8 F (36.6 C) (Temporal)  Ht 5\' 4"  (1.626 m)  Wt 206 lb 12.8 oz (93.804 kg)   BMI 35.50 kg/m2 General:   Alert,  Well-developed, well-nourished, pleasant and cooperative in NAD. Head:  Normocephalic and atraumatic. Eyes:  Sclera clear, no icterus.   Conjunctiva pink. Ears:  Normal auditory acuity. Nose:  No deformity, discharge, or lesions. Mouth:  No deformity or lesions,oropharynx pink & moist. Neck:  Supple; no masses or thyromegaly. Lungs:  Clear throughout to auscultation.   No wheezes, crackles, or rhonchi. No acute distress. Heart:  Regular rate and rhythm; no murmurs, clicks, rubs,  or gallops. Abdomen:  Normal bowel sounds.  No bruits.  Soft, non-tender and non-distended without masses, hepatosplenomegaly or hernias noted.  No guarding or rebound tenderness.   Rectal:  Deferred until time of colonoscopy Msk:  Symmetrical without gross deformities. Normal posture. Pulses:  Normal pulses noted. Extremities:  No clubbing or edema. Neurologic:  Alert and  oriented x4;  grossly normal neurologically. Skin:  Intact without significant lesions or rashes. Lymph Nodes:  No significant cervical adenopathy. Psych:  Alert and cooperative. Normal mood and affect.

## 2012-01-16 ENCOUNTER — Encounter: Payer: Self-pay | Admitting: Internal Medicine

## 2012-01-17 ENCOUNTER — Encounter (HOSPITAL_COMMUNITY): Payer: Self-pay | Admitting: Internal Medicine

## 2012-07-08 ENCOUNTER — Ambulatory Visit: Payer: Medicare Other

## 2012-07-08 ENCOUNTER — Ambulatory Visit
Admission: RE | Admit: 2012-07-08 | Discharge: 2012-07-08 | Disposition: A | Discharge status: Home / Self Care | Payer: Medicare Other | Source: Ambulatory Visit | Attending: Oncology | Admitting: Oncology

## 2012-07-08 DIAGNOSIS — D0501 Lobular carcinoma in situ of right breast: Secondary | ICD-10-CM

## 2012-07-08 DIAGNOSIS — D249 Benign neoplasm of unspecified breast: Secondary | ICD-10-CM

## 2013-06-11 ENCOUNTER — Other Ambulatory Visit: Payer: Self-pay

## 2013-06-11 DIAGNOSIS — Z1231 Encounter for screening mammogram for malignant neoplasm of breast: Secondary | ICD-10-CM

## 2013-06-11 DIAGNOSIS — Z853 Personal history of malignant neoplasm of breast: Secondary | ICD-10-CM

## 2013-06-11 DIAGNOSIS — Z9889 Other specified postprocedural states: Secondary | ICD-10-CM

## 2013-07-15 ENCOUNTER — Ambulatory Visit
Admission: RE | Admit: 2013-07-15 | Discharge: 2013-07-15 | Disposition: A | Discharge status: Home / Self Care | Payer: Medicare Other | Source: Ambulatory Visit

## 2013-07-15 DIAGNOSIS — Z853 Personal history of malignant neoplasm of breast: Secondary | ICD-10-CM

## 2013-07-15 DIAGNOSIS — Z9889 Other specified postprocedural states: Secondary | ICD-10-CM

## 2013-07-15 DIAGNOSIS — Z1231 Encounter for screening mammogram for malignant neoplasm of breast: Secondary | ICD-10-CM

## 2013-11-25 ENCOUNTER — Telehealth: Payer: Self-pay | Admitting: *Deleted

## 2013-11-25 MED ORDER — HYDROCORTISONE ACETATE 25 MG RE SUPP
25.0000 mg | Freq: Two times a day (BID) | RECTAL | Status: DC
Start: 2013-11-25 — End: 2013-12-02

## 2013-11-25 NOTE — Telephone Encounter (Signed)
Pt is aware. rx has been sent to the pharmacy.  Manuela Schwartz, please move pt appt and call her. Thanks.

## 2013-11-25 NOTE — Telephone Encounter (Signed)
Called pt- she started seeing blood on April 24th, then went several days without seeing any, but then went several days in a row seeing blood in the stool. The blood is bright red. No fever, no pain, no N/V. Pt stated she feels good, she is just concerned because her sister had colon cancer. Pt had a tcs in 12/2011. Pt takes colace 100mg  2 qhs and has a bm daily but it is still somewhat hard. Pt has ov for 12/25/13 with AS and still wants to be seen, but would like to be seen sooner and would like something called in to stop the bleeding. Please advise.

## 2013-11-25 NOTE — Telephone Encounter (Signed)
Pt called- the pharmacy called her and she cannot afford the suppositories. Can you recommend something else?

## 2013-11-25 NOTE — Telephone Encounter (Signed)
Pt called states she is having blood when she has a BM pt is not sure if it is in her stools or just when she wipes, I made pt a appointment 12/25/13 at 2:00 with Vicente Males, pt would like to be sooner because she is having blood everyday when she has a BM. Please advise [phone]

## 2013-11-25 NOTE — Telephone Encounter (Signed)
Andrea Ramos: Let's try and move the appointment up to sometime in the next 1-2 weeks with AS. We know she has hemorrhoids from prior colonoscopy. Please call in [facility] suppositories one PR twice a day x10 days. Manage constipation as needed. This approach is no substitute for further evaluation of rectal bleeding. She may need colonoscopy, etc. Question hemorrhoid banding. She will need to keep her office visit appointment.

## 2013-11-26 NOTE — Telephone Encounter (Signed)
I know of nothing less expensive

## 2013-11-27 NOTE — Telephone Encounter (Signed)
Pt is aware of OV being moved up to 5/12 at 1130 with AS and to disregard OV for 6/4

## 2013-12-02 ENCOUNTER — Encounter: Payer: Self-pay | Admitting: Gastroenterology

## 2013-12-02 ENCOUNTER — Encounter (INDEPENDENT_AMBULATORY_CARE_PROVIDER_SITE_OTHER): Payer: Self-pay

## 2013-12-02 ENCOUNTER — Ambulatory Visit (INDEPENDENT_AMBULATORY_CARE_PROVIDER_SITE_OTHER): Payer: Medicare Other | Admitting: Gastroenterology

## 2013-12-02 VITALS — BP 152/83 | HR 81 | Temp 97.6°F | Ht 64.0 in | Wt 209.8 lb

## 2013-12-02 DIAGNOSIS — K5900 Constipation, unspecified: Secondary | ICD-10-CM | POA: Insufficient documentation

## 2013-12-02 DIAGNOSIS — K921 Melena: Secondary | ICD-10-CM

## 2013-12-02 MED ORDER — HYDROCORTISONE 2.5 % RE CREA: 1.0000 "application " | TOPICAL_CREAM | Freq: Two times a day (BID) | RECTAL | Status: DC

## 2013-12-02 MED ORDER — LINACLOTIDE 145 MCG PO CAPS: 145.0000 ug | ORAL_CAPSULE | Freq: Every day | ORAL | Status: DC

## 2013-12-02 NOTE — Progress Notes (Signed)
Referring Provider: Dione Housekeeper, MD Primary Care Physician:  Sherrie Mustache, MD Primary GI: Dr. Gala Romney    Chief Complaint  Patient presents with  . Rectal Bleeding    HPI:   Andrea Ramos presents today with concerns of rectal bleeding. She has a history of known internal hemorrhoids, with last colonoscopy fairly recently in June 2013 with tubular adenoma. Due for surveillance in 2018. April 24th had a BM, wiped and noted fresh blood. Streaky blood on BM. In the afternoon urinated and passed a little more blood per rectum. May 1st, BM with a scant amount of blood. May have seen some in March as well. None since May 1st. Very rare straining. Takes stool softener each evening. Thinks she may need something a little stronger. Semi-hard stools. No rectal discomfort or pain. No abdominal pain. Rarely needs to take a Zantac due to mild indigestion. Was unable to fill Anusol suppositories as it was 300$.   Next colonoscopy 2018.   Past Medical History  Diagnosis Date  . 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 Heart murmur 03/2002  . Diverticulosis     Past Surgical History  Procedure Laterality Date  . Colonoscopy  11/16/2004     Rourk-Normal rectum aside from internal hemorrhoids/ Scattered left sided diverticula/ Polyp at 30 cm removed, path unavailable at this time, diverticulosis  . Vaginal hysterectomy  12/2003    partial, both ovaries remain  . Breast lumpectomy      Rt Breast  . Colonoscopy  01/15/2012    Dr. Rourk:Minimal internal hemorrhoids. Colonic diverticulosis Single diminutive polyp-status post cold biopsy removal I suspect relatively trivial GI bleed recently. Tubular adenoma    Current Outpatient Prescriptions  Medication Sig Dispense Refill  . meloxicam (MOBIC) 7.5 MG tablet Take 7.5 mg by mouth daily.       . valsartan-hydrochlorothiazide (DIOVAN-HCT) 160-12.5 MG per tablet Take 1 tablet by mouth  daily.      . Vitamin D, Ergocalciferol, (DRISDOL) 50000 UNITS CAPS Take 50,000 Units by mouth every 7 (seven) days.      . hydrocortisone (ANUSOL-HC) 2.5 % rectal cream Place 1 application rectally 2 (two) times daily.  30 g  1  . Linaclotide (LINZESS) 145 MCG CAPS capsule Take 1 capsule (145 mcg total) by mouth daily. 30 minutes before breakfast on an empty stomach.  30 capsule  1   No current facility-administered medications for this visit.    Allergies as of 12/02/2013  . (No Known Allergies)    Family History  Problem Relation Age of Onset  . Colon cancer Sister 67  . Lung cancer Sister 66  . Diabetes Brother   . Multiple myeloma Sister 49    History   Social History  . Marital Status: Married    Spouse Name: N/A    Number of Children: 1  . Years of Education: N/A   Occupational History  . retired; Engineer, mining    Social History Main Topics  . Smoking status: Never Smoker   . Smokeless tobacco: None  . Alcohol Use: No  . Drug Use: No  . Sexual Activity: None   Other Topics Concern  . None   Social History Narrative   Lives w/ husband & 23 yr old granddaughter    Review of Systems: As mentioned in HPI.   Physical Exam: BP 152/83  Pulse 81  Temp(Src) 97.6 F (36.4 C) (  Oral)  Ht 5' 4"  (1.626 m)  Wt 209 lb 12.8 oz (95.165 kg)  BMI 35.99 kg/m2 General:   Alert and oriented. No distress noted. Pleasant and cooperative.  Head:  Normocephalic and atraumatic. Eyes:  Conjuctiva clear without scleral icterus. Mouth:  Oral mucosa pink and moist. Good dentition. No lesions. Heart:  S1, S2 present without murmurs, rubs, or gallops. Regular rate and rhythm. Abdomen:  +BS, soft, non-tender and non-distended. No rebound or guarding. No HSM or masses noted. Rectal: no significant external abnormalities, query mild enlargement of internal hemorrhoids, no obvious mass, stricture, or gross blood appreciated.  Msk:  Symmetrical without gross deformities. Normal  posture. Extremities:  Without edema. Neurologic:  Alert and  oriented x4;  grossly normal neurologically. Skin:  Intact without significant lesions or rashes. Psych:  Alert and cooperative. Normal mood and affect.

## 2013-12-02 NOTE — Patient Instructions (Signed)
I have sent a rectal cream to your pharmacy to use twice a day for 7 days. If the copay is expensive, please let us know so we can order something else.  I have also provided two different medications for you to try for constipation.   1. Linzess 145 mcg. Take this once each morning on an empty stomach, at least 30 minutes before breakfast. If you like this, you may continue to take it. However, if this is too much, go to step # 2.  2. Amitiza 24 mcg. Take once WITH DINNER each evening. You may increase to twice a day with food if needed. I have provided samples.  We will see you back in 3 months regardless. Please let me know how you are doing in the next week or so!

## 2013-12-03 NOTE — Assessment & Plan Note (Addendum)
73 year old female with intermittent low-volume hematochezia in the setting of constipation. Known internal hemorrhoids as likely culprit. With colonoscopy fairly recent, will provide anusol cream, prescriptions for constipation (see constipation) and return in 3 months. She would rather hold off on outpatient [place] banding at the moment. She does have a positive FH of colon cancer (sister), and next surveillance colonoscopy is due in 2018. However, if rectal bleeding continues despite conservative management, consider flex sig vs colonoscopy.

## 2013-12-03 NOTE — Progress Notes (Signed)
cc'd to pcp 

## 2013-12-03 NOTE — Assessment & Plan Note (Signed)
I have provided samples of both Linzess 145 mcg and Amitiza 24 mcg. She will start out with Linzess; if this is too strong, I have advised her to take Amitiza.

## 2013-12-25 ENCOUNTER — Ambulatory Visit: Payer: Medicare Other | Admitting: Gastroenterology

## 2014-01-19 ENCOUNTER — Telehealth: Payer: Self-pay | Admitting: *Deleted

## 2014-01-19 NOTE — Telephone Encounter (Signed)
Pt called stating she is taking linzess and amitiza and pt said she likes amitiza better and would like a RX for it. Please advise [phone] or if it is after 12 please call her cell phone [phone]

## 2014-01-20 MED ORDER — LUBIPROSTONE 24 MCG PO CAPS: 24.0000 ug | ORAL_CAPSULE | Freq: Two times a day (BID) | ORAL | Status: DC

## 2014-01-20 NOTE — Telephone Encounter (Signed)
Completed Amitiza 24 mcg BID, disp# 60 refills 3.

## 2014-01-20 NOTE — Telephone Encounter (Signed)
Routing to refill box  

## 2014-02-23 ENCOUNTER — Ambulatory Visit (INDEPENDENT_AMBULATORY_CARE_PROVIDER_SITE_OTHER): Payer: Medicare Other | Admitting: Gastroenterology

## 2014-02-23 ENCOUNTER — Encounter: Payer: Self-pay | Admitting: Gastroenterology

## 2014-02-23 ENCOUNTER — Encounter (INDEPENDENT_AMBULATORY_CARE_PROVIDER_SITE_OTHER): Payer: Self-pay

## 2014-02-23 VITALS — BP 140/78 | HR 71 | Temp 100.0°F | Resp 18 | Ht 64.0 in

## 2014-02-23 DIAGNOSIS — K5900 Constipation, unspecified: Secondary | ICD-10-CM

## 2014-02-23 MED ORDER — LUBIPROSTONE 24 MCG PO CAPS: 24.0000 ug | ORAL_CAPSULE | Freq: Two times a day (BID) | ORAL | Status: DC

## 2014-02-23 NOTE — Assessment & Plan Note (Signed)
73 year old female with chronic constipation much improved with Amitiza 24 mcg once daily. Previous low-volume hematochezia nearly resolved with addition of Ansuol cream and bowel regimen. Last colonoscopy fairly recent in 2013 with known internal hemorrhoids. Due for above average risk colonoscopy in 2018 due to family history of colon cancer. Recommend continuing Amitiza, monitor for any further low-volume hematochezia. May need to consider early interval colonoscopy if persistent lower GI symptoms. Return in 6 months

## 2014-02-23 NOTE — Patient Instructions (Signed)
Continue to take Amitiza with food as you are doing. I sent the prescription to your pharmacy.  You may use the cream as needed, but avoid using for multiple days at a time (may use 5-7 days).   If you have any further evidence of rectal bleeding or anything out of the ordinary, please call me.  We will see you in 6 months!  Enjoy the rest of your summer!  Orvil Feil, ANP-BC [facility]

## 2014-02-23 NOTE — Progress Notes (Signed)
Referring Provider: Dione Housekeeper, MD Primary Care Physician:  Sherrie Mustache, MD Primary GI: Dr. Gala Romney   Chief Complaint  Patient presents with  . Follow-up    HPI:   Andrea Ramos presents today in routine follow-up with history of constipation and intermittent low-volume hematochezia in the setting of known internal hemorrhoids. Last colonoscopy in 2013; however, she has a family history of colon cancer in first degree relative (sister). She was last seen in May 2015 and provided both Linzess and Amitiza to trial. She preferred the Amitiza 24 mcg dosing. Taking just once daily.  Last evidence of rectal bleeding in June, small amount on BM and tissue hematochezia. None since. Was constipated at that time. Had to strain at that time. Usually has a BM after breakfast and sometimes after dinner.   Past Medical History  Diagnosis Date  . 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 Heart murmur 03/2002  . Diverticulosis     Past Surgical History  Procedure Laterality Date  . Colonoscopy  11/16/2004     Rourk-Normal rectum aside from internal hemorrhoids/ Scattered left sided diverticula/ Polyp at 30 cm removed, path unavailable at this time, diverticulosis  . Vaginal hysterectomy  12/2003    partial, both ovaries remain  . Breast lumpectomy      Rt Breast  . Colonoscopy  01/15/2012    Dr. Rourk:Minimal internal hemorrhoids. Colonic diverticulosis Single diminutive polyp-status post cold biopsy removal I suspect relatively trivial GI bleed recently. Tubular adenoma    Current Outpatient Prescriptions  Medication Sig Dispense Refill  . hydrocortisone (ANUSOL-HC) 2.5 % rectal cream Place 1 application rectally 2 (two) times daily.  30 g  1  . lubiprostone (AMITIZA) 24 MCG capsule Take 1 capsule (24 mcg total) by mouth 2 (two) times daily with a meal.  60 capsule  5  . meloxicam (MOBIC) 7.5 MG tablet Take 7.5 mg by mouth  daily.       . valsartan-hydrochlorothiazide (DIOVAN-HCT) 160-12.5 MG per tablet Take 1 tablet by mouth daily.      . Vitamin D, Ergocalciferol, (DRISDOL) 50000 UNITS CAPS Take 50,000 Units by mouth every 7 (seven) days.       No current facility-administered medications for this visit.    Allergies as of 02/23/2014  . (No Known Allergies)    Family History  Problem Relation Age of Onset  . Colon cancer Sister 79  . Lung cancer Sister 64  . Diabetes Brother   . Multiple myeloma Sister 85    History   Social History  . Marital Status: Married    Spouse Name: N/A    Number of Children: 1  . Years of Education: N/A   Occupational History  . retired; Engineer, mining    Social History Main Topics  . Smoking status: Never Smoker   . Smokeless tobacco: None  . Alcohol Use: No  . Drug Use: No  . Sexual Activity: None   Other Topics Concern  . None   Social History Narrative   Lives w/ husband & 60 yr old granddaughter    Review of Systems: Negative unless mentioned in HPI.   Physical Exam: BP 140/78  Pulse 71  Temp(Src) 100 F (37.8 C) (Oral)  Resp 18  Ht 5' 4"  (1.626 m) General:   Alert and oriented. No distress noted. Pleasant and cooperative.  Head:  Normocephalic and atraumatic. Eyes:  Conjuctiva  clear without scleral icterus. Heart:  S1, S2 present without murmurs, rubs, or gallops. Regular rate and rhythm. Abdomen:  +BS, soft, non-tender and non-distended. No rebound or guarding. No HSM or masses noted. Msk:  Symmetrical without gross deformities. Normal posture. Extremities:  Without edema. Neurologic:  Alert and  oriented x4;  grossly normal neurologically. Skin:  Intact without significant lesions or rashes. Psych:  Alert and cooperative. Normal mood and affect.

## 2014-02-24 NOTE — Progress Notes (Signed)
Cc to PCP 

## 2014-03-19 ENCOUNTER — Other Ambulatory Visit: Payer: Self-pay | Admitting: Gastroenterology

## 2014-06-12 ENCOUNTER — Other Ambulatory Visit: Payer: Self-pay

## 2014-06-12 DIAGNOSIS — Z1231 Encounter for screening mammogram for malignant neoplasm of breast: Secondary | ICD-10-CM

## 2014-07-20 ENCOUNTER — Ambulatory Visit
Admission: RE | Admit: 2014-07-20 | Discharge: 2014-07-20 | Disposition: A | Discharge status: Home / Self Care | Payer: Medicare Other | Source: Ambulatory Visit

## 2014-07-20 ENCOUNTER — Other Ambulatory Visit: Payer: Self-pay

## 2014-07-20 DIAGNOSIS — Z1231 Encounter for screening mammogram for malignant neoplasm of breast: Secondary | ICD-10-CM

## 2015-06-14 ENCOUNTER — Other Ambulatory Visit: Payer: Self-pay

## 2015-06-14 DIAGNOSIS — Z1231 Encounter for screening mammogram for malignant neoplasm of breast: Secondary | ICD-10-CM

## 2015-07-22 ENCOUNTER — Ambulatory Visit
Admission: RE | Admit: 2015-07-22 | Discharge: 2015-07-22 | Disposition: A | Discharge status: Home / Self Care | Payer: Medicare Other | Source: Ambulatory Visit

## 2015-07-22 DIAGNOSIS — Z1231 Encounter for screening mammogram for malignant neoplasm of breast: Secondary | ICD-10-CM

## 2016-06-14 ENCOUNTER — Other Ambulatory Visit: Payer: Self-pay | Admitting: Family Medicine

## 2016-06-14 DIAGNOSIS — Z1231 Encounter for screening mammogram for malignant neoplasm of breast: Secondary | ICD-10-CM

## 2016-07-25 ENCOUNTER — Ambulatory Visit
Admission: RE | Admit: 2016-07-25 | Discharge: 2016-07-25 | Disposition: A | Discharge status: Home / Self Care | Payer: Medicare Other | Source: Ambulatory Visit | Attending: Family Medicine | Admitting: Family Medicine

## 2016-07-25 DIAGNOSIS — Z1231 Encounter for screening mammogram for malignant neoplasm of breast: Secondary | ICD-10-CM

## 2016-11-28 ENCOUNTER — Encounter: Payer: Self-pay | Admitting: Internal Medicine

## 2017-07-12 ENCOUNTER — Other Ambulatory Visit: Payer: Self-pay | Admitting: Family Medicine

## 2017-07-12 DIAGNOSIS — Z1231 Encounter for screening mammogram for malignant neoplasm of breast: Secondary | ICD-10-CM

## 2017-08-09 ENCOUNTER — Ambulatory Visit
Admission: RE | Admit: 2017-08-09 | Discharge: 2017-08-09 | Disposition: A | Discharge status: Home / Self Care | Payer: Medicare Other | Source: Ambulatory Visit | Attending: Family Medicine | Admitting: Family Medicine

## 2017-08-09 DIAGNOSIS — Z1231 Encounter for screening mammogram for malignant neoplasm of breast: Secondary | ICD-10-CM

## 2017-08-09 HISTORY — DX: Malignant neoplasm of unspecified site of unspecified female breast: C50.919

## 2018-02-18 ENCOUNTER — Telehealth: Payer: Self-pay

## 2018-02-18 NOTE — Telephone Encounter (Signed)
PATIENT RESCHEDULED

## 2018-02-18 NOTE — Telephone Encounter (Signed)
Pt wants to r/s her 03/14/18 apt. Contact number [phone]

## 2018-03-14 ENCOUNTER — Ambulatory Visit: Payer: Medicare Other

## 2018-03-26 ENCOUNTER — Ambulatory Visit (INDEPENDENT_AMBULATORY_CARE_PROVIDER_SITE_OTHER): Payer: Medicare Other

## 2018-03-26 DIAGNOSIS — Z86010 Personal history of colonic polyps: Secondary | ICD-10-CM

## 2018-03-26 NOTE — Progress Notes (Signed)
Gastroenterology Pre-Procedure Review  Request Date:03/26/18 Requesting Physician: Dr.Nyland- last tcs 01/15/12 RMR- tubular adenoma  PATIENT REVIEW QUESTIONS: The patient responded to the following health history questions as indicated:    1. Diabetes Melitis: yes (metformin qhs) 2. Joint replacements in the past 12 months: no 3. Major health problems in the past 3 months: no 4. Has an artificial valve or MVP: no 5. Has a defibrillator: no 6. Has been advised in past to take antibiotics in advance of a procedure like teeth cleaning: no 7. Family history of colon cancer: yes (sister- age 2) 48. Alcohol Use: no 9. History of sleep apnea: no  10. History of coronary artery or other vascular stents placed within the last 12 months: no 11. History of any prior anesthesia complications: no    MEDICATIONS & ALLERGIES:    Patient reports the following regarding taking any blood thinners:   Plavix? no Aspirin? no Coumadin? no Brilinta? no Xarelto? no Eliquis? no Pradaxa? no Savaysa? no Effient? no  Patient confirms/reports the following medications:  Current Outpatient Medications  Medication Sig Dispense Refill  . acetaminophen (TYLENOL 8 HOUR ARTHRITIS PAIN) 650 MG CR tablet Take 1,300 mg by mouth daily.    Marland Kitchen LORazepam (ATIVAN) 0.5 MG tablet as needed.    . metFORMIN (GLUCOPHAGE-XR) 500 MG 24 hr tablet 500 mg at bedtime.    . pravastatin (PRAVACHOL) 20 MG tablet 10 mg.    . valsartan-hydrochlorothiazide (DIOVAN-HCT) 160-12.5 MG per tablet Take 1 tablet by mouth daily.    . Vitamin D, Ergocalciferol, (DRISDOL) 50000 UNITS CAPS Take 50,000 Units by mouth every 7 (seven) days.     No current facility-administered medications for this visit.     Patient confirms/reports the following allergies:  No Known Allergies  No orders of the defined types were placed in this encounter.   AUTHORIZATION INFORMATION Primary Insurance: medicare,  ID #: [id number] Pre-Cert / Auth  required:no   SCHEDULE INFORMATION: Procedure has been scheduled as follows:  Date: , Time:  Location:   This Gastroenterology Pre-Precedure Review Form is being routed to the following provider(s): Neil Crouch, PA

## 2018-03-26 NOTE — Progress Notes (Signed)
Pt came in for a nurse visit. I explained to her what her last tcs results were and what the new recommended screening guidelines were for when to stop having tcs's. Pt stated she would like to go home and think about it and will call back and let me know what she wants to do. I have triaged the pt,she is not having any problems at this time. BM's are normal and no blood in the stool, no pain. I will await a return call from the pt.

## 2018-04-06 NOTE — Progress Notes (Signed)
Noted  

## 2018-07-08 ENCOUNTER — Other Ambulatory Visit: Payer: Self-pay | Admitting: Family Medicine

## 2018-07-08 DIAGNOSIS — Z1231 Encounter for screening mammogram for malignant neoplasm of breast: Secondary | ICD-10-CM

## 2018-08-14 ENCOUNTER — Ambulatory Visit
Admission: RE | Admit: 2018-08-14 | Discharge: 2018-08-14 | Disposition: A | Discharge status: Home / Self Care | Payer: Medicare Other | Source: Ambulatory Visit | Attending: Family Medicine | Admitting: Family Medicine

## 2018-08-14 DIAGNOSIS — Z1231 Encounter for screening mammogram for malignant neoplasm of breast: Secondary | ICD-10-CM

## 2018-08-14 HISTORY — DX: Other signs and symptoms in breast: N64.59

## 2019-07-14 ENCOUNTER — Other Ambulatory Visit: Payer: Self-pay | Admitting: Nurse Practitioner

## 2019-07-14 DIAGNOSIS — Z1231 Encounter for screening mammogram for malignant neoplasm of breast: Secondary | ICD-10-CM

## 2019-08-29 ENCOUNTER — Ambulatory Visit: Payer: Medicare Other

## 2019-10-06 ENCOUNTER — Other Ambulatory Visit: Payer: Self-pay

## 2019-10-06 ENCOUNTER — Ambulatory Visit
Admission: RE | Admit: 2019-10-06 | Discharge: 2019-10-06 | Disposition: A | Discharge status: Home / Self Care | Payer: Medicare Other | Source: Ambulatory Visit | Attending: Nurse Practitioner | Admitting: Nurse Practitioner

## 2019-10-06 DIAGNOSIS — Z1231 Encounter for screening mammogram for malignant neoplasm of breast: Secondary | ICD-10-CM

## 2020-09-14 ENCOUNTER — Other Ambulatory Visit: Payer: Self-pay | Admitting: Family Medicine

## 2020-09-14 DIAGNOSIS — Z1231 Encounter for screening mammogram for malignant neoplasm of breast: Secondary | ICD-10-CM

## 2020-11-03 ENCOUNTER — Ambulatory Visit
Admission: RE | Admit: 2020-11-03 | Discharge: 2020-11-03 | Disposition: A | Discharge status: Home / Self Care | Payer: Medicare Other | Source: Ambulatory Visit | Attending: Family Medicine | Admitting: Family Medicine

## 2020-11-03 ENCOUNTER — Other Ambulatory Visit: Payer: Self-pay

## 2020-11-03 DIAGNOSIS — Z1231 Encounter for screening mammogram for malignant neoplasm of breast: Secondary | ICD-10-CM

## 2021-10-03 ENCOUNTER — Other Ambulatory Visit: Payer: Self-pay | Admitting: Family Medicine

## 2021-10-03 DIAGNOSIS — Z1231 Encounter for screening mammogram for malignant neoplasm of breast: Secondary | ICD-10-CM

## 2021-11-04 ENCOUNTER — Ambulatory Visit
Admission: RE | Admit: 2021-11-04 | Discharge: 2021-11-04 | Disposition: A | Discharge status: Home / Self Care | Payer: Medicare Other | Source: Ambulatory Visit | Attending: Family Medicine | Admitting: Family Medicine

## 2021-11-04 DIAGNOSIS — Z1231 Encounter for screening mammogram for malignant neoplasm of breast: Secondary | ICD-10-CM

## 2022-09-29 ENCOUNTER — Other Ambulatory Visit: Payer: Self-pay | Admitting: Family Medicine

## 2022-09-29 DIAGNOSIS — Z1231 Encounter for screening mammogram for malignant neoplasm of breast: Secondary | ICD-10-CM

## 2022-11-14 ENCOUNTER — Ambulatory Visit: Payer: Medicare Other

## 2023-01-09 ENCOUNTER — Ambulatory Visit
Admission: RE | Admit: 2023-01-09 | Discharge: 2023-01-09 | Disposition: A | Discharge status: Home / Self Care | Payer: Medicare Other | Source: Ambulatory Visit | Attending: Family Medicine | Admitting: Family Medicine

## 2023-01-09 DIAGNOSIS — Z1231 Encounter for screening mammogram for malignant neoplasm of breast: Secondary | ICD-10-CM

## 2023-12-12 ENCOUNTER — Other Ambulatory Visit: Payer: Self-pay | Admitting: Family Medicine

## 2023-12-12 DIAGNOSIS — Z1231 Encounter for screening mammogram for malignant neoplasm of breast: Secondary | ICD-10-CM

## 2024-01-10 ENCOUNTER — Ambulatory Visit
Admission: RE | Admit: 2024-01-10 | Discharge: 2024-01-10 | Disposition: A | Discharge status: Home / Self Care | Source: Ambulatory Visit | Attending: Family Medicine | Admitting: Family Medicine

## 2024-01-10 DIAGNOSIS — Z1231 Encounter for screening mammogram for malignant neoplasm of breast: Secondary | ICD-10-CM

## 2024-02-13 IMAGING — MG MM DIGITAL SCREENING BILAT W/ TOMO AND CAD
8 series · 8 of 24 positions shown · non-contrast
Comparison: Previous exam(s).

CLINICAL DATA: Screening.

EXAM:
DIGITAL SCREENING BILATERAL MAMMOGRAM WITH TOMOSYNTHESIS AND CAD
TECHNIQUE: Bilateral screening digital craniocaudal and mediolateral oblique
mammograms were obtained. Bilateral screening digital breast
tomosynthesis was performed. The images were evaluated with
computer-aided detection.

[R CC synth-2D]
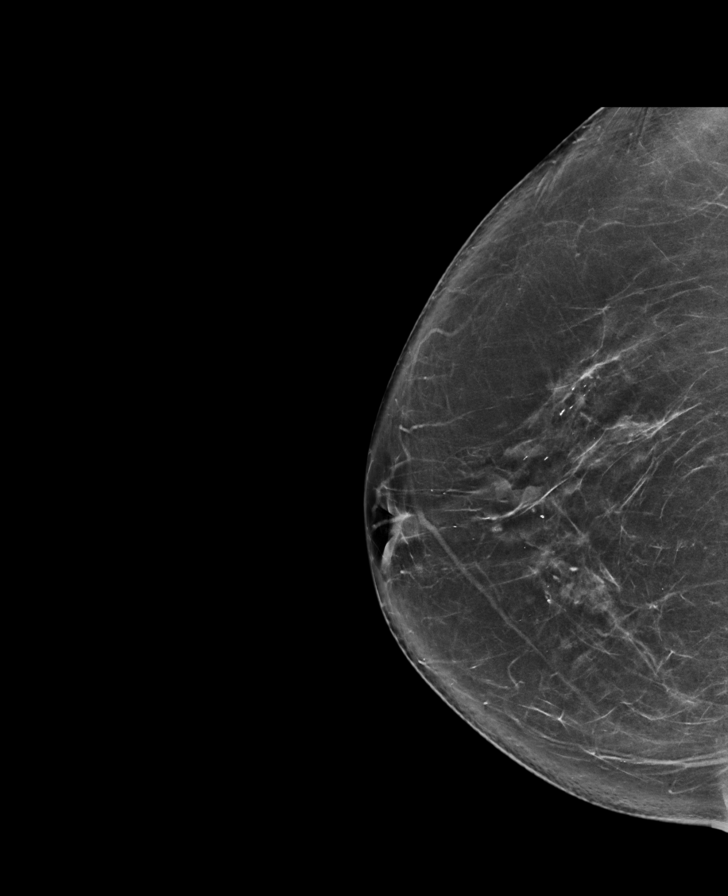

[R MLO synth-2D]
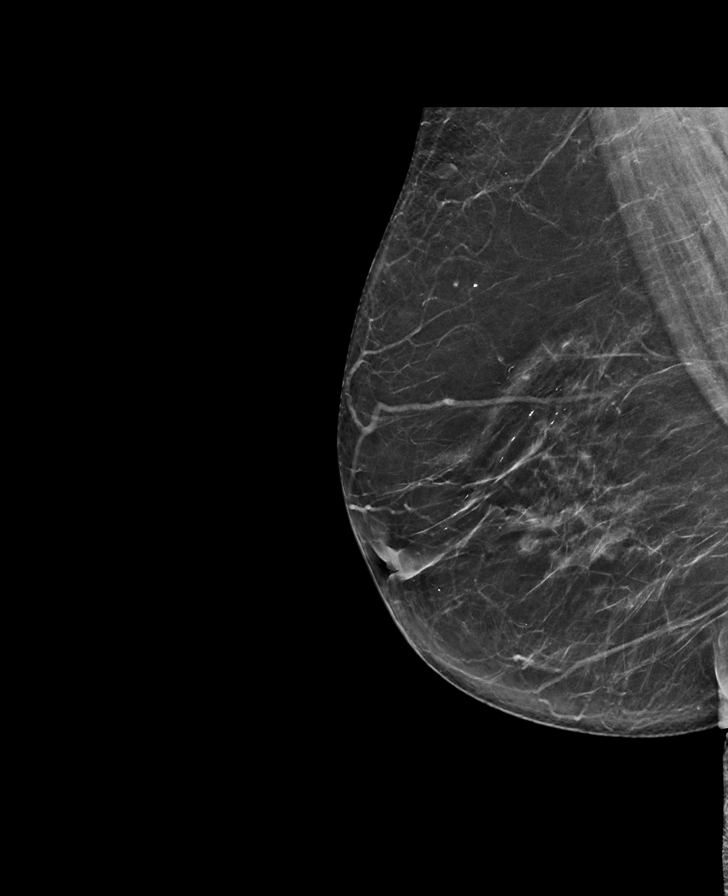

[L MLO synth-2D]
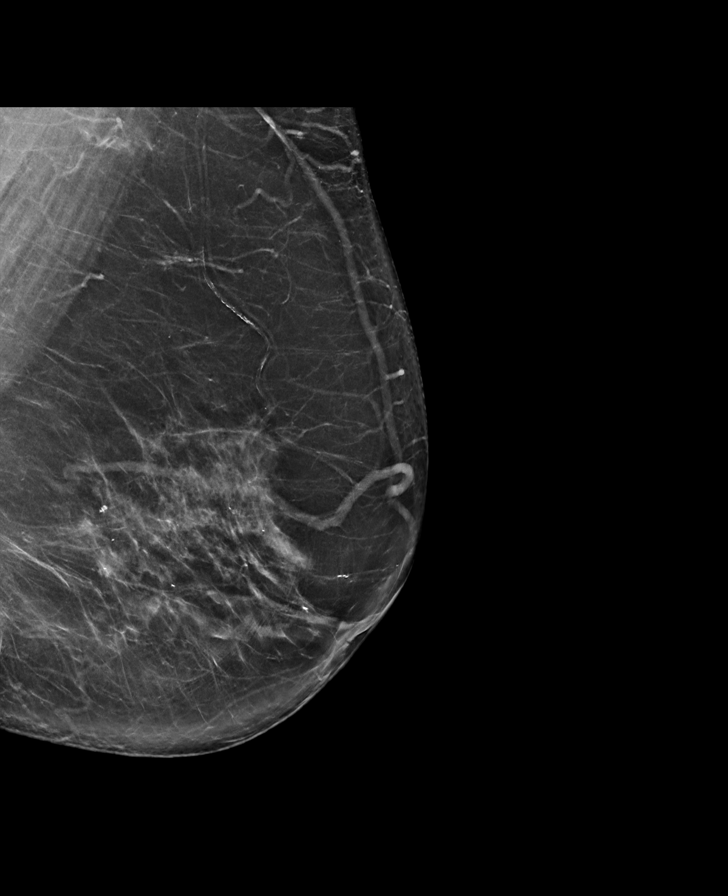

[L CC synth-2D]
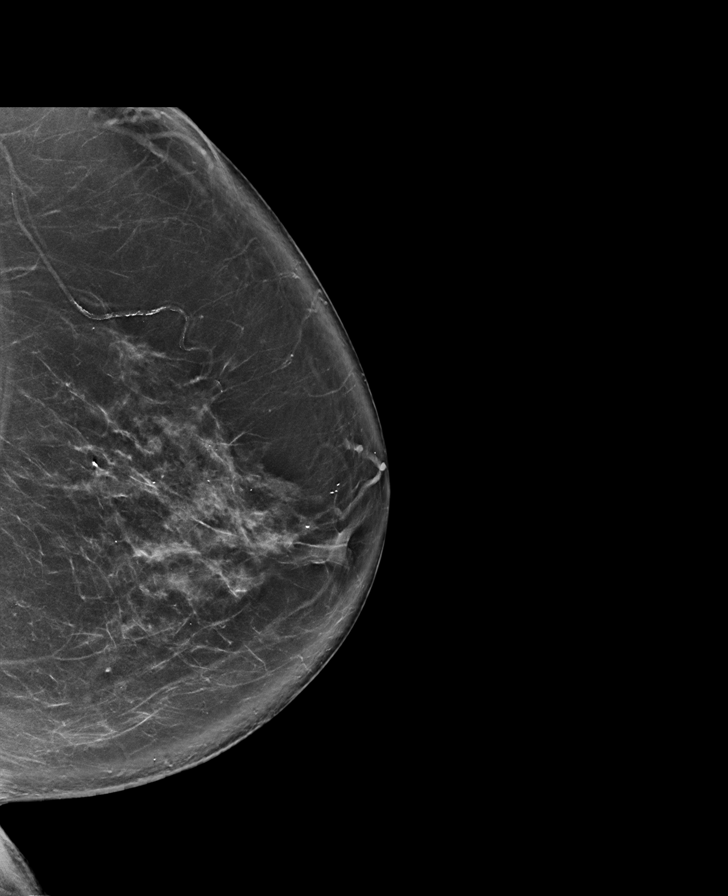

[R CC tomo · tomo slice 41/81.0]
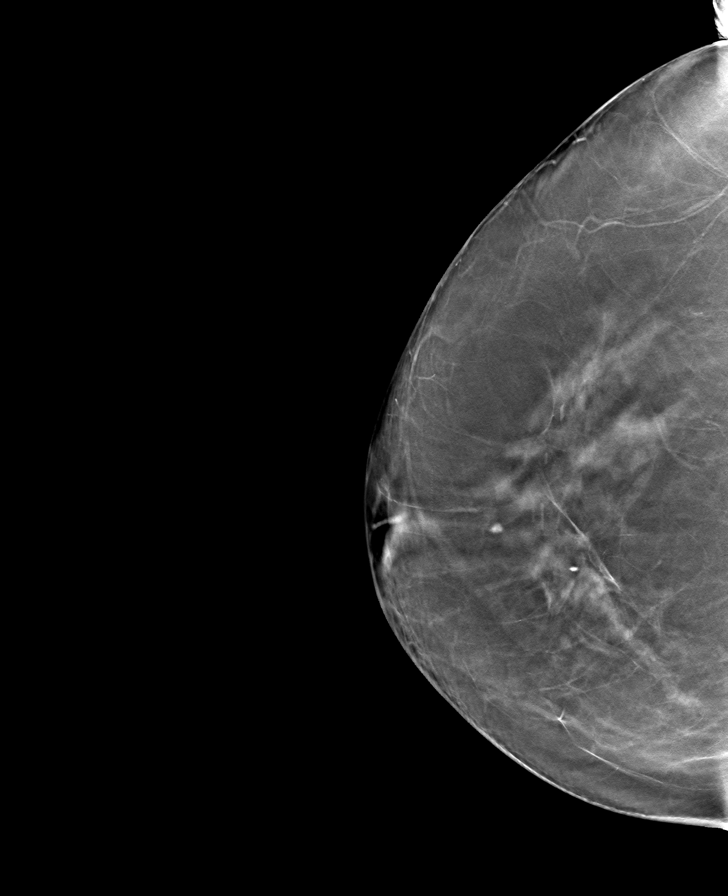

[L MLO tomo · tomo slice 44/87.0]
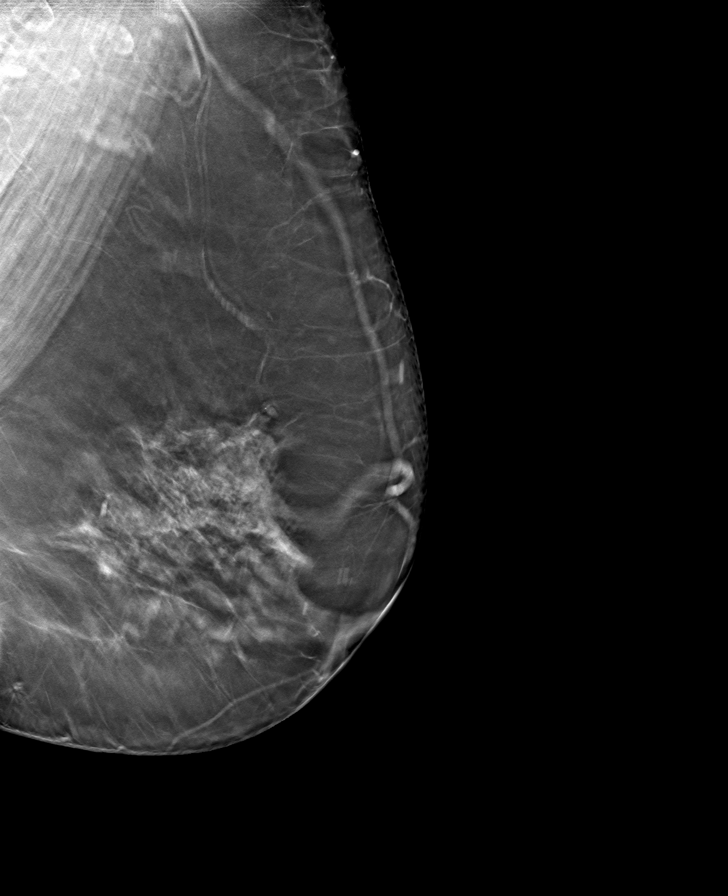

[R MLO tomo · tomo slice 40/79.0]
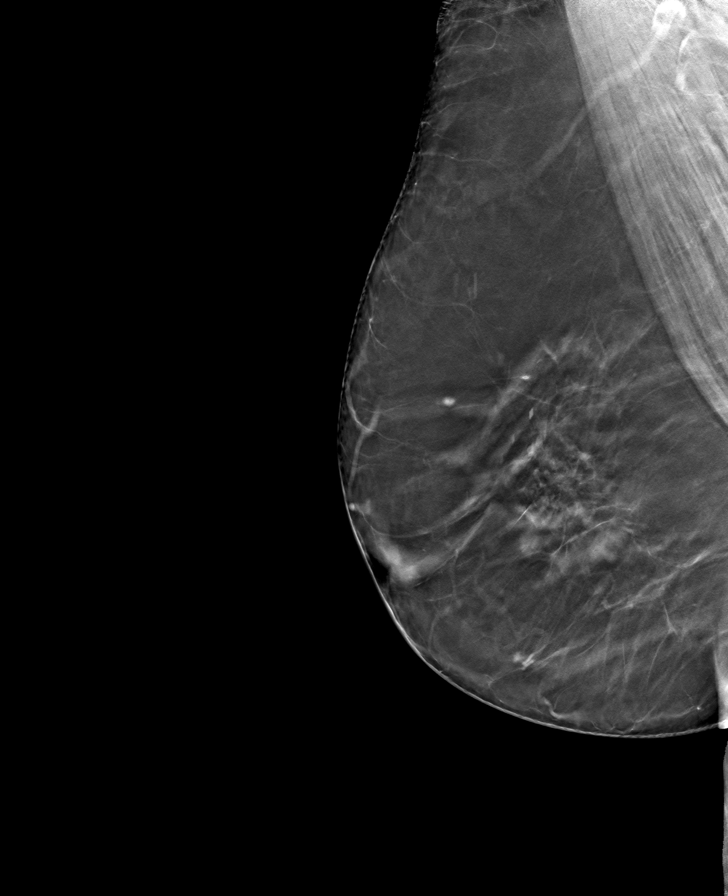

[L CC tomo · tomo slice 45/90.0]
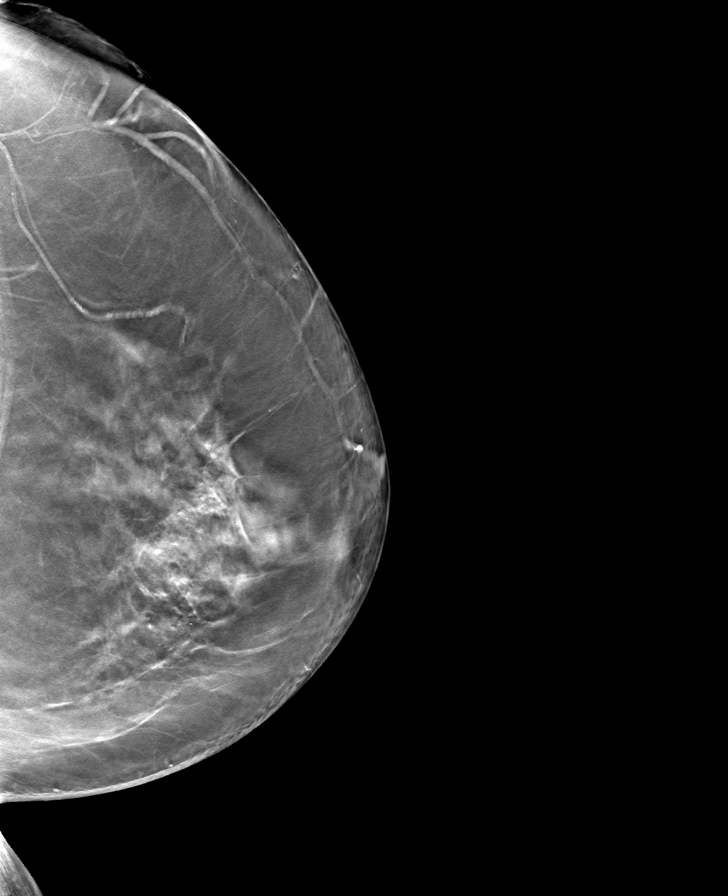

[8 of 24 positions shown; findings below may reference images not displayed]

ACR Breast Density Category b: There are scattered areas of
fibroglandular density.
FINDINGS: There are no findings suspicious for malignancy.
IMPRESSION: No mammographic evidence of malignancy. A result letter of this
screening mammogram will be mailed directly to the patient.

RECOMMENDATION:
Screening mammogram in one year. (Code:51-O-LD2)

BI-RADS CATEGORY  1: Negative.
